# Patient Record
Sex: Male | Born: 1975 | Race: White | Hispanic: No | Marital: Single | Smoking: Current every day smoker
Health system: Southern US, Community
[De-identification: ages and names within clinical notes are randomized; demographics above are authoritative.]

## PROBLEM LIST (undated history)

## (undated) DIAGNOSIS — J45909 Unspecified asthma, uncomplicated: Secondary | ICD-10-CM

## (undated) DIAGNOSIS — Z8669 Personal history of other diseases of the nervous system and sense organs: Secondary | ICD-10-CM

---

## 2004-02-24 ENCOUNTER — Emergency Department (HOSPITAL_COMMUNITY): Admission: AD | Admit: 2004-02-24 | Discharge: 2004-02-24 | Payer: Self-pay | Admitting: Emergency Medicine

## 2004-02-25 ENCOUNTER — Emergency Department (HOSPITAL_COMMUNITY): Admission: AD | Admit: 2004-02-25 | Discharge: 2004-02-25 | Payer: Self-pay | Admitting: Family Medicine

## 2015-10-27 ENCOUNTER — Encounter: Payer: Self-pay | Admitting: Emergency Medicine

## 2015-10-27 ENCOUNTER — Emergency Department
Admission: EM | Admit: 2015-10-27 | Discharge: 2015-10-27 | Disposition: A | Discharge status: Home / Self Care | Payer: Self-pay | Attending: Emergency Medicine | Admitting: Emergency Medicine

## 2015-10-27 ENCOUNTER — Emergency Department: Payer: Self-pay

## 2015-10-27 DIAGNOSIS — Z880 Allergy status to penicillin: Secondary | ICD-10-CM | POA: Insufficient documentation

## 2015-10-27 DIAGNOSIS — S61032A Puncture wound without foreign body of left thumb without damage to nail, initial encounter: Secondary | ICD-10-CM | POA: Insufficient documentation

## 2015-10-27 DIAGNOSIS — Z23 Encounter for immunization: Secondary | ICD-10-CM | POA: Insufficient documentation

## 2015-10-27 DIAGNOSIS — W268XXA Contact with other sharp object(s), not elsewhere classified, initial encounter: Secondary | ICD-10-CM | POA: Insufficient documentation

## 2015-10-27 DIAGNOSIS — Y9289 Other specified places as the place of occurrence of the external cause: Secondary | ICD-10-CM | POA: Insufficient documentation

## 2015-10-27 DIAGNOSIS — F17210 Nicotine dependence, cigarettes, uncomplicated: Secondary | ICD-10-CM | POA: Insufficient documentation

## 2015-10-27 DIAGNOSIS — Y9389 Activity, other specified: Secondary | ICD-10-CM | POA: Insufficient documentation

## 2015-10-27 DIAGNOSIS — Y998 Other external cause status: Secondary | ICD-10-CM | POA: Insufficient documentation

## 2015-10-27 DIAGNOSIS — S61230A Puncture wound without foreign body of right index finger without damage to nail, initial encounter: Secondary | ICD-10-CM | POA: Insufficient documentation

## 2015-10-27 MED ORDER — TETANUS-DIPHTH-ACELL PERTUSSIS 5-2.5-18.5 LF-MCG/0.5 IM SUSP
0.5000 mL | Freq: Once | INTRAMUSCULAR | Status: AC
  Administered 2015-10-27: 0.5 mL via INTRAMUSCULAR
  Filled 2015-10-27: qty 0.5

## 2015-10-27 NOTE — Discharge Instructions (Signed)
Puncture Wound A puncture wound is an injury that is caused by a sharp, thin object that goes through (penetrates) your skin. Usually, a puncture wound does not leave a large opening in your skin, so it may not bleed a lot. However, when you get a puncture wound, dirt or other materials (foreign bodies) can be forced into your wound and break off inside. This increases the chance of infection, such as tetanus. CAUSES Puncture wounds are caused by any sharp, thin object that goes through your skin, such as:  Animal teeth, as with an animal bite.  Sharp, pointed objects, such as nails, splinters of glass, fishhooks, and needles. SYMPTOMS Symptoms of a puncture wound include:  Pain.  Bleeding.  Swelling.  Bruising.  Fluid leaking from the wound.  Numbness, tingling, or loss of function. DIAGNOSIS This condition is diagnosed with a medical history and physical exam. Your wound will be checked to see if it contains any foreign bodies. You may also have X-rays or other imaging tests. TREATMENT Treatment for a puncture wound depends on how serious the wound is. It also depends on whether the wound contains any foreign bodies. Treatment for all types of puncture wounds usually starts with:  Controlling the bleeding.  Washing out the wound with a germ-free (sterile) salt-water solution.  Checking the wound for foreign bodies. Treatment may also include:  Having the wound opened surgically to remove a foreign object.  Closing the wound with stitches (sutures) if it continues to bleed.  Covering the wound with antibiotic ointments and a bandage (dressing).  Receiving a tetanus shot.  Receiving a rabies vaccine. HOME CARE INSTRUCTIONS Medicines  Take or apply over-the-counter and prescription medicines only as told by your health care provider.  If you were prescribed an antibiotic, take or apply it as told by your health care provider. Do not stop using the antibiotic even if  your condition improves. Wound Care  There are many ways to close and cover a wound. For example, a wound can be covered with sutures, skin glue, or adhesive strips. Follow instructions from your health care provider about:  How to take care of your wound.  When and how you should change your dressing.  When you should remove your dressing.  Removing whatever was used to close your wound.  Keep the dressing dry as told by your health care provider. Do not take baths, swim, use a hot tub, or do anything that would put your wound underwater until your health care provider approves.  Clean the wound as told by your health care provider.  Do not scratch or pick at the wound.  Check your wound every day for signs of infection. Watch for:  Redness, swelling, or pain.  Fluid, blood, or pus. General Instructions  Raise (elevate) the injured area above the level of your heart while you are sitting or lying down.  If your puncture wound is in your foot, ask your health care provider if you need to avoid putting weight on your foot and for how long.  Keep all follow-up visits as told by your health care provider. This is important. SEEK MEDICAL CARE IF:  You received a tetanus shot and you have swelling, severe pain, redness, or bleeding at the injection site.  You have a fever.  Your sutures come out.  You notice a bad smell coming from your wound or your dressing.  You notice something coming out of your wound, such as wood or glass.  Your   pain is not controlled with medicine.  You have increased redness, swelling, or pain at the site of your wound.  You have fluid, blood, or pus coming from your wound.  You notice a change in the color of your skin near your wound.  You need to change the dressing frequently due to fluid, blood, or pus draining from your wound.  You develop a new rash.  You develop numbness around your wound. SEEK IMMEDIATE MEDICAL CARE IF:  You  develop severe swelling around your wound.  Your pain suddenly increases and is severe.  You develop painful skin lumps.  You have a red streak going away from your wound.  The wound is on your hand or foot and you cannot properly move a finger or toe.  The wound is on your hand or foot and you notice that your fingers or toes look pale or bluish.   This information is not intended to replace advice given to you by your health care provider. Make sure you discuss any questions you have with your health care provider.   Document Released: 08/20/2005 Document Revised: 08/01/2015 Document Reviewed: 01/03/2015 Elsevier Interactive Patient Education 2016 [employer]  

## 2015-10-27 NOTE — ED Provider Notes (Signed)
[facility] Emergency Department Provider Note  ____________________________________________  Time seen: Approximately 5:02 PM  I have reviewed the triage vital signs and the nursing notes.   HISTORY  Chief Complaint Hand Pain    HPI Jacob Duffy is a 39 y.o. male who presents to the emergency department for right index and left thumb pain. He states that he was changing a tire yesterday when he accidentally impaled these fingers with a metal wire. He states that he has had increased pain, swelling, and foreign body sensation to these digits. He is tried over-the-counter medications with no relief. He is concerned for embedded foreign body. He is unsure of last tetanus shot.   History reviewed. No pertinent past medical history.  There are no active problems to display for this patient.   History reviewed. No pertinent past surgical history.  No current outpatient prescriptions on file.  Allergies Penicillins  No family history on file.  Social History Social History  Substance Use Topics  . Smoking status: Current Some Day Smoker -- 1.00 packs/day    Types: Cigarettes  . Smokeless tobacco: None  . Alcohol Use: Yes    Review of Systems Constitutional: No fever/chills Eyes: No visual changes. ENT: No sore throat. Cardiovascular: Denies chest pain. Respiratory: Denies shortness of breath. Gastrointestinal: No abdominal pain.  No nausea, no vomiting.  No diarrhea.  No constipation. Genitourinary: Negative for dysuria. Musculoskeletal: Negative for back pain. Skin: Negative for rash. Endorses puncture wound to right index and left thumb. Neurological: Negative for headaches, focal weakness or numbness.  10-point ROS otherwise negative.  ____________________________________________   PHYSICAL EXAM:  VITAL SIGNS: ED Triage Vitals  Enc Vitals Group     BP 10/27/15 1652 105/61 mmHg     Pulse Rate 10/27/15 1652 72     Resp 10/27/15  1652 18     Temp 10/27/15 1652 98.2 F (36.8 C)     Temp Source 10/27/15 1652 Oral     SpO2 10/27/15 1652 98 %     Weight 10/27/15 1652 165 lb (74.844 kg)     Height 10/27/15 1652  (1.702 m)     Head Cir --      Peak Flow --      Pain Score 10/27/15 1652 5     Pain Loc --      Pain Edu? --      Excl. in GC? --     Constitutional: Alert and oriented. Well appearing and in no acute distress. Eyes: Conjunctivae are normal. PERRL. EOMI. Head: Atraumatic. Nose: No congestion/rhinnorhea. Mouth/Throat: Mucous membranes are moist.  Oropharynx non-erythematous. Neck: No stridor.   Cardiovascular: Normal rate, regular rhythm. Grossly normal heart sounds.  Good peripheral circulation. Respiratory: Normal respiratory effort.  No retractions. Lungs CTAB. Gastrointestinal: Soft and nontender. No distention. No abdominal bruits. No CVA tenderness. Musculoskeletal: No lower extremity tenderness nor edema.  No joint effusions. Neurologic:  Normal speech and language. No gross focal neurologic deficits are appreciated. No gait instability. Skin:  Skin is warm, dry and intact. No rash noted. Small puncture wound noted to the pad of the second digit right hand. No visible foreign body. Minor edema and erythema directly surrounding puncture wound. Minor puncture wound on the left thumb lateral ear pad. Minor edema and erythema directly surrounding puncture wound. No visible foreign body. Psychiatric: Mood and affect are normal. Speech and behavior are normal.  ____________________________________________   LABS (all labs ordered are listed, but only abnormal results  are displayed)  Labs Reviewed - No data to display ____________________________________________  EKG   ____________________________________________  RADIOLOGY  Second digit x-ray right hand Impression: Negative. No evidence of foreign body.  Thumb x-ray left hand Impression: No evidence of foreign body.  Images were  personally reviewed by myself. ____________________________________________   PROCEDURES  Procedure(s) performed: None  Critical Care performed: No  ____________________________________________   INITIAL IMPRESSION / ASSESSMENT AND PLAN / ED COURSE  Pertinent labs & imaging results that were available during my care of the patient were reviewed by me and considered in my medical decision making (see chart for details).  Patient's history, symptoms, physical exam are taken and the consideration for diagnosis. I advised patient of findings and diagnosis and he verbalizes understanding of same. I explained the treatment plan to the patient and the patient verbalizes understanding and compliance with same. Patient is to follow-up with primary care provider or specialist provided on paperwork for further evaluation and treatment should symptoms persist past treatment course. All of the patient's questions are answered. ____________________________________________   FINAL CLINICAL IMPRESSION(S) / ED DIAGNOSES  Final diagnoses:  Puncture wound of left thumb, initial encounter  Puncture wound of right index finger without foreign body without damage to nail, initial encounter      Racheal PatchesJonathan D Margorie Renner, PA-C 10/27/15 1832  Jene Everyobert Kinner, MD 10/29/15 1434

## 2015-10-27 NOTE — ED Notes (Signed)
Pt states was changing a tire yesterday and when grabbed tire a wire became embedded in right index finger and left thumb.

## 2016-01-31 ENCOUNTER — Emergency Department
Admission: EM | Admit: 2016-01-31 | Discharge: 2016-01-31 | Disposition: A | Discharge status: Home / Self Care | Payer: Self-pay | Attending: Emergency Medicine | Admitting: Emergency Medicine

## 2016-01-31 ENCOUNTER — Encounter: Payer: Self-pay | Admitting: Emergency Medicine

## 2016-01-31 ENCOUNTER — Emergency Department: Payer: Self-pay

## 2016-01-31 DIAGNOSIS — Y9289 Other specified places as the place of occurrence of the external cause: Secondary | ICD-10-CM | POA: Insufficient documentation

## 2016-01-31 DIAGNOSIS — F17210 Nicotine dependence, cigarettes, uncomplicated: Secondary | ICD-10-CM | POA: Insufficient documentation

## 2016-01-31 DIAGNOSIS — W1839XA Other fall on same level, initial encounter: Secondary | ICD-10-CM | POA: Insufficient documentation

## 2016-01-31 DIAGNOSIS — Y998 Other external cause status: Secondary | ICD-10-CM | POA: Insufficient documentation

## 2016-01-31 DIAGNOSIS — Y9389 Activity, other specified: Secondary | ICD-10-CM | POA: Insufficient documentation

## 2016-01-31 DIAGNOSIS — S29001A Unspecified injury of muscle and tendon of front wall of thorax, initial encounter: Secondary | ICD-10-CM | POA: Insufficient documentation

## 2016-01-31 DIAGNOSIS — R0789 Other chest pain: Secondary | ICD-10-CM

## 2016-01-31 DIAGNOSIS — Z880 Allergy status to penicillin: Secondary | ICD-10-CM | POA: Insufficient documentation

## 2016-01-31 HISTORY — DX: Personal history of other diseases of the nervous system and sense organs: Z86.69

## 2016-01-31 HISTORY — DX: Unspecified asthma, uncomplicated: J45.909

## 2016-01-31 LAB — CBC
HCT: 39.4 % — ABNORMAL LOW (ref 40.0–52.0)
HEMOGLOBIN: 13.3 g/dL (ref 13.0–18.0)
MCH: 31.6 pg (ref 26.0–34.0)
MCHC: 33.8 g/dL (ref 32.0–36.0)
MCV: 93.5 fL (ref 80.0–100.0)
Platelets: 241 10*3/uL (ref 150–440)
RBC: 4.21 MIL/uL — ABNORMAL LOW (ref 4.40–5.90)
RDW: 12.9 % (ref 11.5–14.5)
WBC: 11.1 10*3/uL — AB (ref 3.8–10.6)

## 2016-01-31 LAB — BASIC METABOLIC PANEL
Anion gap: 4 — ABNORMAL LOW (ref 5–15)
BUN: 10 mg/dL (ref 6–20)
CHLORIDE: 106 mmol/L (ref 101–111)
CO2: 31 mmol/L (ref 22–32)
Calcium: 8.6 mg/dL — ABNORMAL LOW (ref 8.9–10.3)
Creatinine, Ser: 0.73 mg/dL (ref 0.61–1.24)
Glucose, Bld: 100 mg/dL — ABNORMAL HIGH (ref 65–99)
POTASSIUM: 3.7 mmol/L (ref 3.5–5.1)
SODIUM: 141 mmol/L (ref 135–145)

## 2016-01-31 LAB — TROPONIN I

## 2016-01-31 MED ORDER — NAPROXEN 500 MG PO TABS: 500.0000 mg | ORAL_TABLET | Freq: Two times a day (BID) | ORAL | Status: DC

## 2016-01-31 NOTE — ED Notes (Signed)
MD Stafford at bedside. 

## 2016-01-31 NOTE — ED Notes (Signed)
Patient transported to X-ray 

## 2016-01-31 NOTE — ED Provider Notes (Signed)
[facility] Emergency Department Provider Note  ____________________________________________  Time seen: 6:50 pm  I have reviewed the triage vital signs and the nursing notes.   HISTORY  Chief Complaint Chest Pain    HPI Jacob Duffy is a 40 y.o. male who complains of left-sided chest pain for the past 3 days. This started after he got drunk one night and fell down. He is not sure if he fell on the back area of his chest. It hurts to take a deep breath and hurts to lie on his left side. No nausea vomiting shortness of breath diaphoresis. It's worse with movement. No head injury or neck pain.  Pain is sharp, moderate intensity, nonexertional.   Past Medical History  Diagnosis Date  . History of seizures as a child   . Asthma      There are no active problems to display for this patient.    History reviewed. No pertinent past surgical history.   Current Outpatient Rx  Name  Route  Sig  Dispense  Refill  . naproxen (NAPROSYN) 500 MG tablet   Oral   Take 1 tablet (500 mg total) by mouth 2 (two) times daily with a meal.   20 tablet   0      Allergies Penicillins   No family history on file.  Social History Social History  Substance Use Topics  . Smoking status: Current Some Day Smoker -- 1.00 packs/day    Types: Cigarettes  . Smokeless tobacco: None  . Alcohol Use: Yes    Review of Systems  Constitutional:   No fever or chills. No weight changes Eyes:   No blurry vision or double vision.  ENT:   No sore throat.  Cardiovascular:   Chest pain as above. Respiratory:   No dyspnea or cough. Gastrointestinal:   Negative for abdominal pain, vomiting and diarrhea.  No BRBPR or melena. Genitourinary:   Negative for dysuria or difficulty urinating. Musculoskeletal:   Negative for back pain. No joint swelling or pain. Skin:   Negative for rash. Neurological:   Negative for headaches, focal weakness or numbness. Psychiatric:  No  anxiety or depression.   Endocrine:  No changes in energy or sleep difficulty.  10-point ROS otherwise negative.  ____________________________________________   PHYSICAL EXAM:  VITAL SIGNS: ED Triage Vitals  Enc Vitals Group     BP 01/31/16 1825 111/54 mmHg     Pulse Rate 01/31/16 1825 70     Resp 01/31/16 1825 20     Temp 01/31/16 1825 98.3 F (36.8 C)     Temp Source 01/31/16 1825 Oral     SpO2 01/31/16 1825 96 %     Weight 01/31/16 1825 165 lb (74.844 kg)     Height 01/31/16 1825 5\' 7"  (1.702 m)     Head Cir --      Peak Flow --      Pain Score 01/31/16 1836 10     Pain Loc --      Pain Edu? --      Excl. in GC? --     Vital signs reviewed, nursing assessments reviewed.   Constitutional:   Alert and oriented. Well appearing and in no distress. Eyes:   No scleral icterus. No conjunctival pallor. PERRL. EOMI ENT   Head:   Normocephalic and atraumatic.   Nose:   No congestion/rhinnorhea. No septal hematoma   Mouth/Throat:   MMM, no pharyngeal erythema. No peritonsillar mass.    Neck:  No stridor. No SubQ emphysema. No meningismus. Hematological/Lymphatic/Immunilogical:   No cervical lymphadenopathy. Cardiovascular:   RRR. Symmetric bilateral radial and DP pulses.  No murmurs.  Respiratory:   Normal respiratory effort without tachypnea nor retractions. Breath sounds are clear and equal bilaterally. No wheezes/rales/rhonchi. Gastrointestinal:   Soft and nontender. Non distended. There is no CVA tenderness.  No rebound, rigidity, or guarding. Genitourinary:   deferred Musculoskeletal:   Nontender with normal range of motion in all extremities. No joint effusions.  No lower extremity tenderness.  No edema. Left chest wall tenderness in the area of the left pectoralis. No crepitus or bony point tenderness. Neurologic:   Normal speech and language.  CN 2-10 normal. Motor grossly intact. No gross focal neurologic deficits are appreciated.  Skin:    Skin is  warm, dry and intact. No rash noted.  No petechiae, purpura, or bullae. No ecchymosis Psychiatric:   Mood and affect are normal. ____________________________________________    LABS (pertinent positives/negatives) (all labs ordered are listed, but only abnormal results are displayed) Labs Reviewed  BASIC METABOLIC PANEL - Abnormal; Notable for the following:    Glucose, Bld 100 (*)    Calcium 8.6 (*)    Anion gap 4 (*)    All other components within normal limits  CBC - Abnormal; Notable for the following:    WBC 11.1 (*)    RBC 4.21 (*)    HCT 39.4 (*)    All other components within normal limits  TROPONIN I   ____________________________________________   EKG  Interpreted by me  Date: 01/31/2016  Rate: 67  Rhythm: normal sinus rhythm  QRS Axis: normal  Intervals: normal  ST/T Wave abnormalities: normal  Conduction Disutrbances: none  Narrative Interpretation: unremarkable      ____________________________________________    RADIOLOGY  Chest x-ray unremarkable  ____________________________________________   PROCEDURES   ____________________________________________   INITIAL IMPRESSION / ASSESSMENT AND PLAN / ED COURSE  Pertinent labs & imaging results that were available during my care of the patient were reviewed by me and considered in my medical decision making (see chart for details).  Patient presents with left chest wall pain. Reproducible on exam. Likely due to falling while intoxicated which is impaired his memory of the event. But has some degree of chest wall contusion but no evidence of any pulmonary complications. No evidence of cardiac, patient's. Normal labs and X ray and EKG. Vital signs unremarkable. We'll discharge home, NSAIDs for pain.     ____________________________________________   FINAL CLINICAL IMPRESSION(S) / ED DIAGNOSES  Final diagnoses:  Chest wall pain      Sharman Cheek, MD 01/31/16 1925

## 2016-01-31 NOTE — Discharge Instructions (Signed)

## 2016-01-31 NOTE — ED Notes (Signed)
Pt reports centralized chest pain x3 days, reports pain while laying on side, hurt to take a deep breath. Pt denies nausea, vomiting, shortness of breath. Pt reports falling this weekend, unsure if he landed on side or not.

## 2016-02-13 ENCOUNTER — Encounter: Payer: Self-pay | Admitting: Emergency Medicine

## 2016-02-13 ENCOUNTER — Emergency Department
Admission: EM | Admit: 2016-02-13 | Discharge: 2016-02-13 | Disposition: A | Discharge status: Home / Self Care | Payer: 59 | Attending: Emergency Medicine | Admitting: Emergency Medicine

## 2016-02-13 DIAGNOSIS — Z880 Allergy status to penicillin: Secondary | ICD-10-CM | POA: Insufficient documentation

## 2016-02-13 DIAGNOSIS — K0889 Other specified disorders of teeth and supporting structures: Secondary | ICD-10-CM | POA: Insufficient documentation

## 2016-02-13 DIAGNOSIS — K029 Dental caries, unspecified: Secondary | ICD-10-CM

## 2016-02-13 DIAGNOSIS — F17210 Nicotine dependence, cigarettes, uncomplicated: Secondary | ICD-10-CM | POA: Insufficient documentation

## 2016-02-13 DIAGNOSIS — Z791 Long term (current) use of non-steroidal anti-inflammatories (NSAID): Secondary | ICD-10-CM | POA: Insufficient documentation

## 2016-02-13 MED ORDER — CLINDAMYCIN HCL 300 MG PO CAPS: 300.0000 mg | ORAL_CAPSULE | Freq: Three times a day (TID) | ORAL | Status: DC

## 2016-02-13 NOTE — ED Notes (Signed)
NAD noted at time of D/C. Pt denies questions or concerns. Pt ambulatory to the lobby at this time.  

## 2016-02-13 NOTE — ED Notes (Signed)
PATIENT STATES HE WAS GIVEN A RX FROM [facility] FOR CLINDAMYCIN, BUT HAS NOT PURCHASED THE MEDICATIONS DUE TO NOT BEING ABLE TO AFFORD RX.

## 2016-02-13 NOTE — Discharge Instructions (Signed)
You have been seen in the Emergency Department (ED) today for dental pain.  Please take your prescribed antibiotic.  You may take pain medication as needed but ONLY as prescribed.  You should also take over-the-counter pain medication such as ibuprofen according to the label instructions unless a doctor has previously told you to avoid this type of medication (due to stomach ulcers, for example).  Alternatively you can take ibuprofen 600 mg by mouth three times daily with meals for no more than 5 days.  Please see you dentist as soon as possible; only a dentist will be able to fix your problem(s).  Please see below for dental follow up options.  Return to the ED if you develop worsening pain, fever, pus/drainage, difficulty breathing, or other symptoms that concern you.  Dental Pain   Dental pain may be caused by many things, including:  Tooth decay (cavities or caries). Cavities expose the nerve of your tooth to air and hot or cold temperatures. This can cause pain or discomfort.  Abscess or infection. A dental abscess is a collection of infected pus from a bacterial infection in the inner part of the tooth (pulp). It usually occurs at the end of the tooth's root.  Injury.  An unknown reason (idiopathic). Your pain may be mild or severe. It may only occur when:  You are chewing.  You are exposed to hot or cold temperature.  You are eating or drinking sugary foods or beverages, such as soda or candy. Your pain may also be constant.  HOME CARE INSTRUCTIONS  Watch your dental pain for any changes. The following actions may help to lessen any discomfort that you are feeling:  Take medicines only as directed by your dentist.  If you were prescribed an antibiotic medicine, finish all of it even if you start to feel better.  Keep all follow-up visits as directed by your dentist. This is important.  Do not apply heat to the outside of your face.  Rinse your mouth or gargle with salt water if  directed by your dentist. This helps with pain and swelling.  You can make salt water by adding  tsp of salt to 1 cup of warm water. Apply ice to the painful area of your face:  Put ice in a plastic bag.  Place a towel between your skin and the bag.  Leave the ice on for 20 minutes, 2-3 times per day. Avoid foods or drinks that cause you pain, such as:  Very hot or very cold foods or drinks.  Sweet or sugary foods or drinks. SEEK MEDICAL CARE IF:  Your pain is not controlled with medicines.  Your symptoms are worse.  You have new symptoms. SEEK IMMEDIATE MEDICAL CARE IF:  You are unable to open your mouth.  You are having trouble breathing or swallowing.  You have a fever.  Your face, neck, or jaw is swollen. This information is not intended to replace advice given to you by your health care provider. Make sure you discuss any questions you have with your health care provider.  Document Released: 11/10/2005 Document Revised: 03/27/2015 Document Reviewed: 11/06/2014  Elsevier Interactive Patient Education 2016 Elsevier Inc.     Dental Caries Dental caries (also called tooth decay) is the most common oral disease. It can occur at any age but is more common in children and young adults.  HOW DENTAL CARIES DEVELOPS  The process of decay begins when bacteria and foods (particularly sugars and starches) combine in  your mouth to produce plaque. Plaque is a substance that sticks to the hard, outer surface of a tooth (enamel). The bacteria in plaque produce acids that attack enamel. These acids may also attack the root surface of a tooth (cementum) if it is exposed. Repeated attacks dissolve these surfaces and create holes in the tooth (cavities). If left untreated, the acids destroy the other layers of the tooth.  RISK FACTORS  Frequent sipping of sugary beverages.   Frequent snacking on sugary and starchy foods, especially those that easily get stuck in the teeth.   Poor oral hygiene.    Dry mouth.   Substance abuse such as methamphetamine abuse.   Broken or poor-fitting dental restorations.   Eating disorders.   Gastroesophageal reflux disease (GERD).   Certain radiation treatments to the head and neck. SYMPTOMS In the early stages of dental caries, symptoms are seldom present. Sometimes white, chalky areas may be seen on the enamel or other tooth layers. In later stages, symptoms may include:  Pits and holes on the enamel.  Toothache after sweet, hot, or cold foods or drinks are consumed.  Pain around the tooth.  Swelling around the tooth. DIAGNOSIS  Most of the time, dental caries is detected during a regular dental checkup. A diagnosis is made after a thorough medical and dental history is taken and the surfaces of your teeth are checked for signs of dental caries. Sometimes special instruments, such as lasers, are used to check for dental caries. Dental X-ray exams may be taken so that areas not visible to the eye (such as between the contact areas of the teeth) can be checked for cavities.  TREATMENT  If dental caries is in its early stages, it may be reversed with a fluoride treatment or an application of a remineralizing agent at the dental office. Thorough brushing and flossing at home is needed to aid these treatments. If it is in its later stages, treatment depends on the location and extent of tooth destruction:   If a small area of the tooth has been destroyed, the destroyed area will be removed and cavities will be filled with a material such as gold, silver amalgam, or composite resin.   If a large area of the tooth has been destroyed, the destroyed area will be removed and a cap (crown) will be fitted over the remaining tooth structure.   If the center part of the tooth (pulp) is affected, a procedure called a root canal will be needed before a filling or crown can be placed.   If most of the tooth has been destroyed, the tooth may need to  be pulled (extracted). HOME CARE INSTRUCTIONS You can prevent, stop, or reverse dental caries at home by practicing good oral hygiene. Good oral hygiene includes:  Thoroughly cleaning your teeth at least twice a day with a toothbrush and dental floss.   Using a fluoride toothpaste. A fluoride mouth rinse may also be used if recommended by your dentist or health care provider.   Restricting the amount of sugary and starchy foods and sugary liquids you consume.   Avoiding frequent snacking on these foods and sipping of these liquids.   Keeping regular visits with a dentist for checkups and cleanings. PREVENTION   Practice good oral hygiene.  Consider a dental sealant. A dental sealant is a coating material that is applied by your dentist to the pits and grooves of teeth. The sealant prevents food from being trapped in them. It may protect  the teeth for several years.  Ask about fluoride supplements if you live in a community without fluorinated water or with water that has a low fluoride content. Use fluoride supplements as directed by your dentist or health care provider.  Allow fluoride varnish applications to teeth if directed by your dentist or health care provider.   This information is not intended to replace advice given to you by your health care provider. Make sure you discuss any questions you have with your health care provider.   Document Released: 08/02/2002 Document Revised: 12/01/2014 Document Reviewed: 11/12/2012 Elsevier Interactive Patient Education 2016 [employer].   OPTIONS FOR DENTAL FOLLOW UP CARE  [place] Department of Health and Human Services - Local Safety Net Dental Clinics [email or web address]   [facility] [phone])  Sharl Ma [phone])  [place] [phone])  [facility] [phone])  [facility]  [phone]) This clinic caters to the indigent population and is on a lottery system. Location: [employer] [facility], [employer], [address] [place] Clinic Hours: Wednesdays from 6pm - 9pm, patients seen by a lottery system. For dates, call or go to [email or web address] Services: Cleanings, fillings and simple extractions. Payment Options: DENTAL WORK IS FREE OF CHARGE. Bring proof of income or support. Best way to get seen: Arrive at 5:15 pm - this is a lottery, NOT first come/first serve, so arriving earlier will not increase your chances of being seen.     [facility] [phone] Select option 1 for emergencies   Location: [facility], [place], [address], [place] Clinic Hours: No walk-ins accepted - call the day before to schedule an appointment. Check in times are 9:30 am and 1:30 pm. Services: Simple extractions, temporary fillings, pulpectomy/pulp debridement, uncomplicated abscess drainage. Payment Options: PAYMENT IS DUE AT THE TIME OF SERVICE.  Fee is usually $100-200, additional surgical procedures (e.g. abscess drainage) may be extra. Cash, checks, Visa/MasterCard accepted.  Can file Medicaid if patient is covered for dental - patient should call case worker to check. No discount for [facility] patients. Best way to get seen: MUST call the day before and get onto the schedule. Can usually be seen the next 1-2 days. No walk-ins accepted.     [facility] [phone]   Location: [facility], [address] [place] Clinic Hours: M, W, Th, F 8am or 1:30pm, Tues 9a or 1:30 - first come/first served. Services: Simple extractions, temporary fillings, uncomplicated abscess drainage.  You do not need to be an [facility] resident. Payment Options: PAYMENT IS DUE AT THE TIME OF SERVICE. Dental insurance, otherwise sliding scale - bring  proof of income or support. Depending on income and treatment needed, cost is usually $50-200. Best way to get seen: Arrive early as it is first come/first served.     [facility] [phone]   Location: [address] Clinic Hours: Mon-Thu 8a-5p Services: Most basic dental services including extractions and fillings. Payment Options: PAYMENT IS DUE AT THE TIME OF SERVICE. Sliding scale, up to 50% off - bring proof if income or support. Medicaid with dental option accepted. Best way to get seen: Call to schedule an appointment, can usually be seen within 2 weeks OR they will try to see walk-ins - show up at 8a or 2p (you may have to wait).     [facility] [phone] [facility] RESIDENTS  ONLY   Location: [facility], [address], [place], [place] [id number] Clinic Hours: By appointment only. Monday - Thursday 8am-5pm, Friday 8am-12pm Services: Cleanings, fillings, extractions. Payment Options: PAYMENT IS DUE AT THE TIME OF SERVICE. Cash, Visa or MasterCard. Sliding scale - $30 minimum per service. Best way to get seen: Come in to office, complete packet and make an appointment - need proof of income or support monies for each household member and proof of [place] residence. Usually takes about a month to get in.     [facility] [phone]   Location: [address] [facility] Hours: Walk-in Urgent Care Dental Services are offered Monday-Friday mornings only. The numbers of emergencies accepted daily is limited to the number of providers available. Maximum 15 - Mondays, Wednesdays & Thursdays Maximum 10 - Tuesdays & Fridays Services: You do not need to be a [place] resident to be seen for a dental emergency. Emergencies are defined as pain, swelling, abnormal bleeding, or dental trauma. Walkins will receive x-rays if  needed. NOTE: Dental cleaning is not an emergency. Payment Options: PAYMENT IS DUE AT THE TIME OF SERVICE. Minimum co-pay is $40.00 for uninsured patients. Minimum co-pay is $3.00 for Medicaid with dental coverage. Dental Insurance is accepted and must be presented at time of visit. Medicare does not cover dental. Forms of payment: Cash, credit card, checks. Best way to get seen: If not previously registered with the clinic, walk-in dental registration begins at 7:15 am and is on a first come/first serve basis. If previously registered with the clinic, call to make an appointment.     [facility] [phone] [place] RESIDENTS ONLY   Location: [address], [place], [place] Clinic Hours: Mon-Thu 10a-2p Services: Extractions only! Payment Options: FREE (donations accepted) - bring proof of income or support Best way to get seen: Call and schedule an appointment OR come at 8am on the 1st Monday of every month (except for holidays) when it is first come/first served.     [facility] [phone]   Location: [address], [place] Clinic Hours: Friday mornings Services, Payment Options, Best way to get seen: Call for info

## 2016-02-13 NOTE — ED Provider Notes (Signed)
[facility] Emergency Department Provider Note  ____________________________________________  Time seen: Approximately 4:07 PM  I have reviewed the triage vital signs and the nursing notes.   HISTORY  Chief Complaint Dental Pain    HPI Jacob Duffy is a 40 y.o. male with no significant past medical history who presents with complaint of gradual onset dental pain that has been going on for weeks.  He saw a dentist about one week ago and was given a prescription for clindamycin which she has not filled due to the cost being about $30.  He smokes tobacco daily.  He has no other significant medical issues.  He reports that their plan is to take out a tooth about a month but reportedly he was told to take the antibiotics in the meantime.  He left work last night due to the pain and would like a doctor's note today.  Nothing makes the pain better and heat and cold makes it worse.  The pain is ranging in intensity from mild to severe.  It is primarily in the right upper molars.  He has had no fever/chills, nausea, vomiting, diarrhea, abdominal pain.He describes the pain as sharp and stabbing.   Past Medical History  Diagnosis Date  . History of seizures as a child   . Asthma     There are no active problems to display for this patient.   History reviewed. No pertinent past surgical history.  Current Outpatient Rx  Name  Route  Sig  Dispense  Refill  . clindamycin (CLEOCIN) 300 MG capsule   Oral   Take 1 capsule (300 mg total) by mouth 3 (three) times daily.   30 capsule   0   . naproxen (NAPROSYN) 500 MG tablet   Oral   Take 1 tablet (500 mg total) by mouth 2 (two) times daily with a meal.   20 tablet   0     Allergies Penicillins  No family history on file.  Social History Social History  Substance Use Topics  . Smoking status: Current Some Day Smoker -- 1.00 packs/day    Types: Cigarettes  . Smokeless tobacco: None  . Alcohol Use: Yes     Review of Systems Constitutional: No fever/chills Eyes: No visual changes. ENT: No sore throat.  Dental pain primarily in the right upper molars Cardiovascular: Denies chest pain. Respiratory: Denies shortness of breath. Gastrointestinal: No abdominal pain.  No nausea, no vomiting.  No diarrhea.  No constipation. Genitourinary: Negative for dysuria. Musculoskeletal: Negative for back pain. Skin: Negative for rash. Neurological: Negative for headaches, focal weakness or numbness.  10-point ROS otherwise negative.  ____________________________________________   PHYSICAL EXAM:  VITAL SIGNS: ED Triage Vitals  Enc Vitals Group     BP 02/13/16 1433 108/76 mmHg     Pulse Rate 02/13/16 1432 85     Resp 02/13/16 1432 18     Temp 02/13/16 1432 97.9 F (36.6 C)     Temp Source 02/13/16 1432 Oral     SpO2 02/13/16 1432 97 %     Weight 02/13/16 1432 165 lb (74.844 kg)     Height 02/13/16 1432 5\' 7"  (1.702 m)     Head Cir --      Peak Flow --      Pain Score 02/13/16 1432 10     Pain Loc --      Pain Edu? --      Excl. in GC? --     Constitutional:  Alert and oriented. Well appearing and in no acute distress. Nose: No congestion/rhinnorhea. Mouth/Throat: Mucous membranes are moist.  Oropharynx non-erythematous.  Extensive dental caries throughout.  No evidence of abscess.  No evidence of Ludwig's angina. Respiratory: Normal respiratory effort.  No retractions.  Musculoskeletal: No lower extremity tenderness nor edema. No gross deformities of extremities. Neurologic:  Normal speech and language. No gross focal neurologic deficits are appreciated.  Skin:  Skin is warm, dry and intact. No rash noted. Psychiatric: Mood and affect are normal. Speech and behavior are normal.  ____________________________________________   LABS (all labs ordered are listed, but only abnormal results are displayed)  Labs Reviewed - No data to  display ____________________________________________  EKG  None ____________________________________________  RADIOLOGY   No results found.  ____________________________________________   PROCEDURES  Procedure(s) performed: None  Critical Care performed: No ____________________________________________   INITIAL IMPRESSION / ASSESSMENT AND PLAN / ED COURSE  Pertinent labs & imaging results that were available during my care of the patient were reviewed by me and considered in my medical decision making (see chart for details).  Well-appearing, no acute distress, vital signs are stable.  I asked the patient specifically why he is here, and he said he needs a work note.  I have given him a coupon from [employer] to help with the cost of clindamycin and I gave him a work note stating specifically the hours during which he was in the emergency department today and that he may return to work with no restrictions, as well as a new prescription for Clinda because he cannot find the other one.  I encouraged him to follow up with the dentist which will be able to help him with his problem.  ____________________________________________  FINAL CLINICAL IMPRESSION(S) / ED DIAGNOSES  Final diagnoses:  Pain due to dental caries      NEW MEDICATIONS STARTED DURING THIS VISIT:  New Prescriptions   CLINDAMYCIN (CLEOCIN) 300 MG CAPSULE    Take 1 capsule (300 mg total) by mouth 3 (three) times daily.      Note:  This document was prepared using Dragon voice recognition software and may include unintentional dictation errors.   Loleta Rose, MD 02/13/16 [phone]

## 2016-02-13 NOTE — ED Notes (Signed)
Dental pain for a few weeks now.

## 2016-07-21 ENCOUNTER — Emergency Department
Admission: EM | Admit: 2016-07-21 | Discharge: 2016-07-21 | Disposition: A | Discharge status: Home / Self Care | Payer: 59 | Attending: Emergency Medicine | Admitting: Emergency Medicine

## 2016-07-21 ENCOUNTER — Encounter: Payer: Self-pay | Admitting: Emergency Medicine

## 2016-07-21 DIAGNOSIS — R197 Diarrhea, unspecified: Secondary | ICD-10-CM | POA: Insufficient documentation

## 2016-07-21 DIAGNOSIS — R1084 Generalized abdominal pain: Secondary | ICD-10-CM

## 2016-07-21 DIAGNOSIS — F17210 Nicotine dependence, cigarettes, uncomplicated: Secondary | ICD-10-CM | POA: Insufficient documentation

## 2016-07-21 DIAGNOSIS — M545 Low back pain: Secondary | ICD-10-CM | POA: Insufficient documentation

## 2016-07-21 DIAGNOSIS — J45909 Unspecified asthma, uncomplicated: Secondary | ICD-10-CM | POA: Insufficient documentation

## 2016-07-21 LAB — COMPREHENSIVE METABOLIC PANEL
ALBUMIN: 3.6 g/dL (ref 3.5–5.0)
ALK PHOS: 87 U/L (ref 38–126)
ALT: 33 U/L (ref 17–63)
ANION GAP: 5 (ref 5–15)
AST: 28 U/L (ref 15–41)
BUN: 9 mg/dL (ref 6–20)
CO2: 26 mmol/L (ref 22–32)
Calcium: 8.7 mg/dL — ABNORMAL LOW (ref 8.9–10.3)
Chloride: 109 mmol/L (ref 101–111)
Creatinine, Ser: 0.73 mg/dL (ref 0.61–1.24)
GFR calc Af Amer: >60 mL/min (ref >60)
GFR calc non Af Amer: >60 mL/min (ref >60)
GLUCOSE: 111 mg/dL — AB (ref 65–99)
POTASSIUM: 3.9 mmol/L (ref 3.5–5.1)
SODIUM: 140 mmol/L (ref 135–145)
Total Bilirubin: 1 mg/dL (ref 0.3–1.2)
Total Protein: 5.8 g/dL — ABNORMAL LOW (ref 6.5–8.1)

## 2016-07-21 LAB — CBC
HEMATOCRIT: 41.8 % (ref 40.0–52.0)
HEMOGLOBIN: 14.6 g/dL (ref 13.0–18.0)
MCH: 32.6 pg (ref 26.0–34.0)
MCHC: 35 g/dL (ref 32.0–36.0)
MCV: 93.1 fL (ref 80.0–100.0)
Platelets: 206 10*3/uL (ref 150–440)
RBC: 4.49 MIL/uL (ref 4.40–5.90)
RDW: 13.9 % (ref 11.5–14.5)
WBC: 6.7 10*3/uL (ref 3.8–10.6)

## 2016-07-21 LAB — URINALYSIS COMPLETE WITH MICROSCOPIC (ARMC ONLY)
BACTERIA UA: NONE SEEN
Bilirubin Urine: NEGATIVE
GLUCOSE, UA: NEGATIVE mg/dL
Hgb urine dipstick: NEGATIVE
Ketones, ur: NEGATIVE mg/dL
Nitrite: NEGATIVE
Protein, ur: NEGATIVE mg/dL
Specific Gravity, Urine: 1.001 — ABNORMAL LOW (ref 1.005–1.030)
Squamous Epithelial / LPF: NONE SEEN
pH: 7 (ref 5.0–8.0)

## 2016-07-21 LAB — LIPASE, BLOOD: Lipase: 27 U/L (ref 11–51)

## 2016-07-21 MED ORDER — CIPROFLOXACIN HCL 500 MG PO TABS: 500.0000 mg | ORAL_TABLET | Freq: Two times a day (BID) | ORAL | 0 refills | Status: AC

## 2016-07-21 NOTE — ED Notes (Signed)
Pt reports his water at home has e coli in it.  Pt found out today.  Pt reports intermittent abd pain, diarrhea x 2 today.  No vomiting.   Pt also reports lower back pain.  No fevers. Sx for 2 months.   Pt alert.  Speech clear.

## 2016-07-21 NOTE — ED Triage Notes (Signed)
Patient states the his well is contaminated with ecoli.  C/O diarrhea, abdominal pain, back pain x 2 months.

## 2016-07-21 NOTE — ED Provider Notes (Signed)
[facility] Emergency Department Provider Note        Time seen: ----------------------------------------- 4:02 PM on 07/21/2016 -----------------------------------------    I have reviewed the triage vital signs and the nursing notes.   HISTORY  Chief Complaint Abdominal Pain    HPI Jacob Duffy A Kingston is a 40 y.o. male who presents to ER with abdominal discomfort and diarrhea for the last 2 months. Patient states she's also had some low back pain. He denies fevers or chills, states he just moved into a new place several months ago and found out that the well was contaminated with Escherichia coli. He denies other complaints at this time.   Past Medical History:  Diagnosis Date  . Asthma   . History of seizures as a child     There are no active problems to display for this patient.   History reviewed. No pertinent surgical history.  Allergies Penicillins  Social History Social History  Substance Use Topics  . Smoking status: Current Some Day Smoker    Packs/day: 1.00    Types: Cigarettes  . Smokeless tobacco: Never Used  . Alcohol use Yes    Review of Systems Constitutional: Negative for fever. Cardiovascular: Negative for chest pain. Respiratory: Negative for shortness of breath. Gastrointestinal:Positive for abdominal pain, diarrhea Genitourinary: Negative for dysuria. Musculoskeletal: Positive for low back pain Skin: Negative for rash. Neurological: Negative for headaches, focal weakness or numbness.  10-point ROS otherwise negative.  ____________________________________________   PHYSICAL EXAM:  VITAL SIGNS: ED Triage Vitals [07/21/16 1359]  Enc Vitals Group     BP 114/60     Pulse Rate 78     Resp 16     Temp 98.3 F (36.8 C)     Temp Source Oral     SpO2 98 %     Weight 152 lb (68.9 kg)     Height 5\' 7"  (1.702 m)     Head Circumference      Peak Flow      Pain Score 7     Pain Loc      Pain Edu?      Excl.  in GC?     Constitutional: Alert and oriented. Well appearing and in no distress. Eyes: Conjunctivae are normal. PERRL. Normal extraocular movements. ENT   Head: Normocephalic and atraumatic.   Nose: No congestion/rhinnorhea.   Mouth/Throat: Mucous membranes are moist.   Neck: No stridor. Cardiovascular: Normal rate, regular rhythm. No murmurs, rubs, or gallops. Respiratory: Normal respiratory effort without tachypnea nor retractions. Breath sounds are clear and equal bilaterally. No wheezes/rales/rhonchi. Gastrointestinal: Soft and nontender. Normal bowel sounds Musculoskeletal: Nontender with normal range of motion in all extremities. No lower extremity tenderness nor edema. Neurologic:  Normal speech and language. No gross focal neurologic deficits are appreciated.  Skin:  Skin is warm, dry and intact. No rash noted. Psychiatric: Mood and affect are normal. Speech and behavior are normal.  ____________________________________________  ED COURSE:  Pertinent labs & imaging results that were available during my care of the patient were reviewed by me and considered in my medical decision making (see chart for details). Clinical Course  Patient is in no acute distress, he'll be provided a short course of antibiotics to treat any Escherichia coli infection is present. We will check basic labs well.  Procedures ____________________________________________   LABS (pertinent positives/negatives)  Labs Reviewed  COMPREHENSIVE METABOLIC PANEL - Abnormal; Notable for the following:       Result Value  Glucose, Bld 111 (*)    Calcium 8.7 (*)    Total Protein 5.8 (*)    All other components within normal limits  URINALYSIS COMPLETEWITH MICROSCOPIC ([facility] ONLY) - Abnormal; Notable for the following:    Color, Urine COLORLESS (*)    APPearance CLEAR (*)    Specific Gravity, Urine 1.001 (*)    Leukocytes, UA TRACE (*)    All other components within normal limits  LIPASE,  BLOOD  CBC   ____________________________________________  FINAL ASSESSMENT AND PLAN  Diarrhea  Plan: Patient with labs as dictated above. No clear etiology for his low back pain. I have advised that the diarrhea is not related to low back pain. He will be placed on antibiotics, he is stable for outpatient follow-up as advised to strictly drink bottled water.   Emily Filbert, MD   Note: This dictation was prepared with Dragon dictation. Any transcriptional errors that result from this process are unintentional    Emily Filbert, MD 07/21/16 [phone]

## 2016-08-19 ENCOUNTER — Emergency Department
Admission: EM | Admit: 2016-08-19 | Discharge: 2016-08-19 | Disposition: A | Discharge status: Home / Self Care | Payer: 59 | Attending: Emergency Medicine | Admitting: Emergency Medicine

## 2016-08-19 DIAGNOSIS — Z792 Long term (current) use of antibiotics: Secondary | ICD-10-CM | POA: Insufficient documentation

## 2016-08-19 DIAGNOSIS — J45909 Unspecified asthma, uncomplicated: Secondary | ICD-10-CM | POA: Insufficient documentation

## 2016-08-19 DIAGNOSIS — R110 Nausea: Secondary | ICD-10-CM

## 2016-08-19 DIAGNOSIS — R197 Diarrhea, unspecified: Secondary | ICD-10-CM

## 2016-08-19 DIAGNOSIS — F17210 Nicotine dependence, cigarettes, uncomplicated: Secondary | ICD-10-CM | POA: Insufficient documentation

## 2016-08-19 DIAGNOSIS — J069 Acute upper respiratory infection, unspecified: Secondary | ICD-10-CM | POA: Insufficient documentation

## 2016-08-19 DIAGNOSIS — R5383 Other fatigue: Secondary | ICD-10-CM

## 2016-08-19 DIAGNOSIS — Z791 Long term (current) use of non-steroidal anti-inflammatories (NSAID): Secondary | ICD-10-CM | POA: Insufficient documentation

## 2016-08-19 LAB — COMPREHENSIVE METABOLIC PANEL
ALK PHOS: 101 U/L (ref 38–126)
ALT: 24 U/L (ref 17–63)
AST: 26 U/L (ref 15–41)
Albumin: 3.6 g/dL (ref 3.5–5.0)
Anion gap: 7 (ref 5–15)
BILIRUBIN TOTAL: 0.8 mg/dL (ref 0.3–1.2)
BUN: 9 mg/dL (ref 6–20)
CO2: 26 mmol/L (ref 22–32)
CREATININE: 0.75 mg/dL (ref 0.61–1.24)
Calcium: 8.5 mg/dL — ABNORMAL LOW (ref 8.9–10.3)
Chloride: 106 mmol/L (ref 101–111)
GFR calc Af Amer: >60 mL/min (ref >60)
Glucose, Bld: 116 mg/dL — ABNORMAL HIGH (ref 65–99)
Potassium: 3.3 mmol/L — ABNORMAL LOW (ref 3.5–5.1)
Sodium: 139 mmol/L (ref 135–145)
TOTAL PROTEIN: 5.8 g/dL — AB (ref 6.5–8.1)

## 2016-08-19 LAB — CBC
HEMATOCRIT: 44.1 % (ref 40.0–52.0)
HEMOGLOBIN: 14.9 g/dL (ref 13.0–18.0)
MCH: 31.5 pg (ref 26.0–34.0)
MCHC: 33.8 g/dL (ref 32.0–36.0)
MCV: 93 fL (ref 80.0–100.0)
Platelets: 265 10*3/uL (ref 150–440)
RBC: 4.74 MIL/uL (ref 4.40–5.90)
RDW: 13.1 % (ref 11.5–14.5)
WBC: 8 10*3/uL (ref 3.8–10.6)

## 2016-08-19 LAB — LIPASE, BLOOD: LIPASE: 20 U/L (ref 11–51)

## 2016-08-19 MED ORDER — METOCLOPRAMIDE HCL 10 MG PO TABS: 10.0000 mg | ORAL_TABLET | Freq: Four times a day (QID) | ORAL | 0 refills | Status: DC | PRN

## 2016-08-19 NOTE — ED Notes (Signed)
Pt. Reports nausea, fatigue and diarrhea the past 4 days. Pt. Denies changes in diet or schedule. States home water tx for ecoli about 2 months ago and he is now having the same sx his wife was having when they discovered the home water contamination

## 2016-08-19 NOTE — ED Provider Notes (Signed)
[facility] Emergency Department Provider Note   ____________________________________________   First MD Initiated Contact with Patient 08/19/16 2057     (approximate)  I have reviewed the triage vital signs and the nursing notes.   HISTORY  Chief Complaint Fatigue   HPI Jacob Duffy is a 40 y.o. male with a history of asthma who is presenting to the emergency department today with 4 days of fatigue, diarrhea as well as cough and runny nose. He says that he also started to have nausea tonight and that is the reason why he came to the emergency department. He says that he felt nauseous to the point where he felt like he cannot go to work. He denies any fever. Denies any sick contacts. Is concerned about a well and is home which she says was contaminated with Escherichia coli. However, he says his wife does not have any of the same symptoms as he does. Says that she does not have any diarrhea. Says that the well is also been treated with bleach since the contamination was discovered. Says that he is having 2 episodes of diarrhea per day. Does not report any blood in the stool. Denies any burning with urination. Also reports a cough.   Past Medical History:  Diagnosis Date  . Asthma   . History of seizures as a child     There are no active problems to display for this patient.   No past surgical history on file.  Prior to Admission medications   Medication Sig Start Date End Date Taking? Authorizing Provider  clindamycin (CLEOCIN) 300 MG capsule Take 1 capsule (300 mg total) by mouth 3 (three) times daily. 02/13/16   Loleta Rose, MD  naproxen (NAPROSYN) 500 MG tablet Take 1 tablet (500 mg total) by mouth 2 (two) times daily with a meal. 01/31/16   Sharman Cheek, MD    Allergies Penicillins  No family history on file.  Social History Social History  Substance Use Topics  . Smoking status: Current Some Day Smoker    Packs/day: 1.00    Types:  Cigarettes  . Smokeless tobacco: Never Used  . Alcohol use Yes    Review of Systems Constitutional: No fever/chills Eyes: No visual changes. ENT: No sore throat. Cardiovascular: Denies chest pain. Respiratory: Denies shortness of breath. Gastrointestinal: No abdominal pain. no vomiting.  No diarrhea.  No constipation. Genitourinary: Negative for dysuria. Musculoskeletal: Negative for back pain. Skin: Negative for rash. Neurological: Negative for headaches, focal weakness or numbness.  10-point ROS otherwise negative.  ____________________________________________   PHYSICAL EXAM:  VITAL SIGNS: ED Triage Vitals  Enc Vitals Group     BP 08/19/16 2018 135/82     Pulse Rate 08/19/16 2018 75     Resp 08/19/16 2018 18     Temp 08/19/16 2018 98.1 F (36.7 C)     Temp Source 08/19/16 2018 Oral     SpO2 08/19/16 2018 98 %     Weight 08/19/16 2019 151 lb (68.5 kg)     Height 08/19/16 2019 5\' 5"  (1.651 m)     Head Circumference --      Peak Flow --      Pain Score 08/19/16 2019 6     Pain Loc --      Pain Edu? --      Excl. in GC? --     Constitutional: Alert and oriented. Well appearing and in no acute distress. Eyes: Conjunctivae are normal. PERRL. EOMI. Head:  Atraumatic.Normal TMs bilaterally. Nose: Mild rhinorrhea Mouth/Throat: Mucous membranes are moist.  Oropharynx non-erythematous. Neck: No stridor.   Cardiovascular: Normal rate, regular rhythm. Grossly normal heart sounds.   Respiratory: Normal respiratory effort.  No retractions. Lungs CTAB. Gastrointestinal: Soft and nontender. No distention. No CVA tenderness. Musculoskeletal: No lower extremity tenderness nor edema.  No joint effusions. Neurologic:  Normal speech and language. No gross focal neurologic deficits are appreciated. No gait instability. Skin:  Skin is warm, dry and intact. No rash noted. Psychiatric: Mood and affect are normal. Speech and behavior are  normal.  ____________________________________________   LABS (all labs ordered are listed, but only abnormal results are displayed)  Labs Reviewed  COMPREHENSIVE METABOLIC PANEL - Abnormal; Notable for the following:       Result Value   Potassium 3.3 (*)    Glucose, Bld 116 (*)    Calcium 8.5 (*)    Total Protein 5.8 (*)    All other components within normal limits  CBC  LIPASE, BLOOD  URINALYSIS COMPLETEWITH MICROSCOPIC ([facility] ONLY)   ____________________________________________  EKG   ____________________________________________  RADIOLOGY   ____________________________________________   PROCEDURES  Procedure(s) performed:   Procedures  Critical Care performed:   ____________________________________________   INITIAL IMPRESSION / ASSESSMENT AND PLAN / ED COURSE  Pertinent labs & imaging results that were available during my care of the patient were reviewed by me and considered in my medical decision making (see chart for details).  Patient with likely viral syndrome. Constellation of symptoms makes it more likely that he does not have an infectious diarrhea. Also that his wife does not have diarrhea makes it less likely that the presentation is secondary to infectious diarrhea because she is also drinking the well water. The patient will be given a prescription for antiemetics for his nausea. He'll be discharged home. I discussed with him his likely diagnosis of a virus causing his symptoms. He is understanding what to comply with this plan. We also discussed drinking plenty of fluids. [facility].  Clinical Course     ____________________________________________   FINAL CLINICAL IMPRESSION(S) / ED DIAGNOSES  Diarrhea. Nausea. Upper respiratory infection.    NEW MEDICATIONS STARTED DURING THIS VISIT:  New Prescriptions   No medications on file     Note:  This document was prepared using Dragon voice recognition  software and may include unintentional dictation errors.    Myrna Blazeravid Matthew Ocia Simek, MD 08/19/16 2202

## 2016-08-19 NOTE — ED Triage Notes (Signed)
Pt also co mid abd pain at this time.

## 2016-08-19 NOTE — ED Notes (Signed)
Pt was given a specimen cup and asked to give sample when able

## 2016-08-19 NOTE — ED Triage Notes (Signed)
Pt in with co generalized weakness and nausea. No vomiting but does have diarrhea pt drinking fluids.

## 2016-11-23 ENCOUNTER — Emergency Department: Payer: Self-pay

## 2016-11-23 ENCOUNTER — Emergency Department
Admission: EM | Admit: 2016-11-23 | Discharge: 2016-11-23 | Disposition: A | Discharge status: Home / Self Care | Payer: Self-pay | Attending: Emergency Medicine | Admitting: Emergency Medicine

## 2016-11-23 ENCOUNTER — Encounter: Payer: Self-pay | Admitting: Emergency Medicine

## 2016-11-23 DIAGNOSIS — J069 Acute upper respiratory infection, unspecified: Secondary | ICD-10-CM | POA: Insufficient documentation

## 2016-11-23 DIAGNOSIS — F17210 Nicotine dependence, cigarettes, uncomplicated: Secondary | ICD-10-CM | POA: Insufficient documentation

## 2016-11-23 DIAGNOSIS — J45909 Unspecified asthma, uncomplicated: Secondary | ICD-10-CM | POA: Insufficient documentation

## 2016-11-23 DIAGNOSIS — J029 Acute pharyngitis, unspecified: Secondary | ICD-10-CM

## 2016-11-23 LAB — POCT RAPID STREP A: STREPTOCOCCUS, GROUP A SCREEN (DIRECT): NEGATIVE

## 2016-11-23 MED ORDER — CHLORPHENIRAMINE MALEATE 4 MG PO TABS: 4.0000 mg | ORAL_TABLET | Freq: Two times a day (BID) | ORAL | 0 refills | Status: AC | PRN

## 2016-11-23 MED ORDER — AZITHROMYCIN 250 MG PO TABS: ORAL_TABLET | ORAL | 0 refills | Status: AC

## 2016-11-23 MED ORDER — GUAIFENESIN-CODEINE 100-10 MG/5ML PO SOLN: 10.0000 mL | ORAL | 0 refills | Status: DC | PRN

## 2016-11-23 NOTE — ED Triage Notes (Addendum)
Pt to ED with c/o of sore throat and N/V/D that has been ongoing for about a week. Pt states he has had a non-productive cough.

## 2016-11-23 NOTE — ED Provider Notes (Signed)
[facility] Emergency Department Provider Note   ____________________________________________   None    (approximate)  I have reviewed the triage vital signs and the nursing notes.   HISTORY  Chief Complaint Sore Throat and Emesis    HPI Jacob Duffy is a 40 y.o. male presents for evaluation one-week history of cough congestion and drainage. Patient states he's had a little bit nauseated diarrhea this morning has had 2 loose bowel movements only.   Past Medical History:  Diagnosis Date  . Asthma   . History of seizures as a child     There are no active problems to display for this patient.   History reviewed. No pertinent surgical history.  Prior to Admission medications   Medication Sig Start Date End Date Taking? Authorizing Provider  azithromycin (ZITHROMAX Z-PAK) 250 MG tablet Take 2 tablets (500 mg) on  Day 1,  followed by 1 tablet (250 mg) once daily on Days 2 through 5. 11/23/16   Evangeline Dakinharles M Beers, PA-C  chlorpheniramine (CHLOR-TRIMETON) 4 MG tablet Take 1 tablet (4 mg total) by mouth 2 (two) times daily as needed for allergies or rhinitis. 11/23/16   Charmayne Sheerharles M Beers, PA-C  guaiFENesin-codeine 100-10 MG/5ML syrup Take 10 mLs by mouth every 4 (four) hours as needed for cough. 11/23/16   Evangeline Dakinharles M Beers, PA-C    Allergies Penicillins  History reviewed. No pertinent family history.  Social History Social History  Substance Use Topics  . Smoking status: Current Some Day Smoker    Packs/day: 1.00    Types: Cigarettes  . Smokeless tobacco: Never Used  . Alcohol use Yes    Review of Systems Constitutional: Denies fever/chills ENT: Positive sore throat. Cardiovascular: Denies chest pain. Respiratory: Denies shortness of breath. Positive cough Gastrointestinal: No abdominal pain.  No nausea, Positive vomiting.  Positive diarrhea.  No constipation. Genitourinary: Negative for dysuria. Musculoskeletal: Negative for back  pain. Skin: Negative for rash. Neurological: Negative for headaches, focal weakness or numbness.  10-point ROS otherwise negative.  ____________________________________________   PHYSICAL EXAM:  VITAL SIGNS: ED Triage Vitals  Enc Vitals Group     BP 11/23/16 1649 125/74     Pulse Rate 11/23/16 1649 84     Resp 11/23/16 1649 18     Temp 11/23/16 1649 98.2 F (36.8 C)     Temp Source 11/23/16 1649 Oral     SpO2 11/23/16 1649 100 %     Weight 11/23/16 1649 155 lb (70.3 kg)     Height 11/23/16 1649 5\' 7"  (1.702 m)     Head Circumference --      Peak Flow --      Pain Score 11/23/16 1650 10     Pain Loc --      Pain Edu? --      Excl. in GC? --     Constitutional: Alert and oriented. Well appearing and in no acute distress. Head: Atraumatic. Nose: Positive congestion/rhinnorhea. Mouth/Throat: Mucous membranes are moist.  Oropharynx non-erythematous. Neck: No stridor.  Supple full range of motion Cardiovascular: Normal rate, regular rhythm. Grossly normal heart sounds.  Good peripheral circulation. Respiratory: Normal respiratory effort.  No retractions. Lungs CTAB. Gastrointestinal: Soft and nontender. No distention. No abdominal bruits. No CVA tenderness. Musculoskeletal: No lower extremity tenderness nor edema.  No joint effusions. Neurologic:  Normal speech and language. No gross focal neurologic deficits are appreciated. No gait instability. Skin:  Skin is warm, dry and intact. No rash noted. Psychiatric: Mood  and affect are normal. Speech and behavior are normal.  ____________________________________________   LABS (all labs ordered are listed, but only abnormal results are displayed)  Labs Reviewed  CULTURE, GROUP A STREP [facility]  POCT RAPID STREP A   ____________________________________________  EKG   ____________________________________________  RADIOLOGY  yes's findings. Negative for  pneumonia. ____________________________________________   PROCEDURES  Procedure(s) performed: None  Procedures  Critical Care performed: No  ____________________________________________   INITIAL IMPRESSION / ASSESSMENT AND PLAN / ED COURSE  Pertinent labs & imaging results that were available during my care of the patient were reviewed by me and considered in my medical decision making (see chart for details).  Acute URI. Rx given for Zithromax, chlorpheniramine and Robitussin-AC. Patient follow-up with PCP or return to ER for any worsening symptomology.  Clinical Course      ____________________________________________   FINAL CLINICAL IMPRESSION(S) / ED DIAGNOSES  Final diagnoses:  Pharyngitis, unspecified etiology  Upper respiratory tract infection, unspecified type      NEW MEDICATIONS STARTED DURING THIS VISIT:  New Prescriptions   AZITHROMYCIN (ZITHROMAX Z-PAK) 250 MG TABLET    Take 2 tablets (500 mg) on  Day 1,  followed by 1 tablet (250 mg) once daily on Days 2 through 5.   CHLORPHENIRAMINE (CHLOR-TRIMETON) 4 MG TABLET    Take 1 tablet (4 mg total) by mouth 2 (two) times daily as needed for allergies or rhinitis.   GUAIFENESIN-CODEINE 100-10 MG/5ML SYRUP    Take 10 mLs by mouth every 4 (four) hours as needed for cough.     Note:  This document was prepared using Dragon voice recognition software and may include unintentional dictation errors.   Evangeline Dakinharles M Beers, PA-C 11/23/16 1850    Jeanmarie PlantJames A McShane, MD 11/23/16 [phone]

## 2017-02-02 IMAGING — CR DG CHEST 2V
1 series · 2 of 2 positions shown · non-contrast
Comparison: None.

CLINICAL DATA: Centralized chest pain for 3 days. Pain with deep
breath.

EXAM:
CHEST  2 VIEW

[Series 1: dg chest 2 view · 0.14mm/px · 2 of 2 slices shown]
[im 1/2]
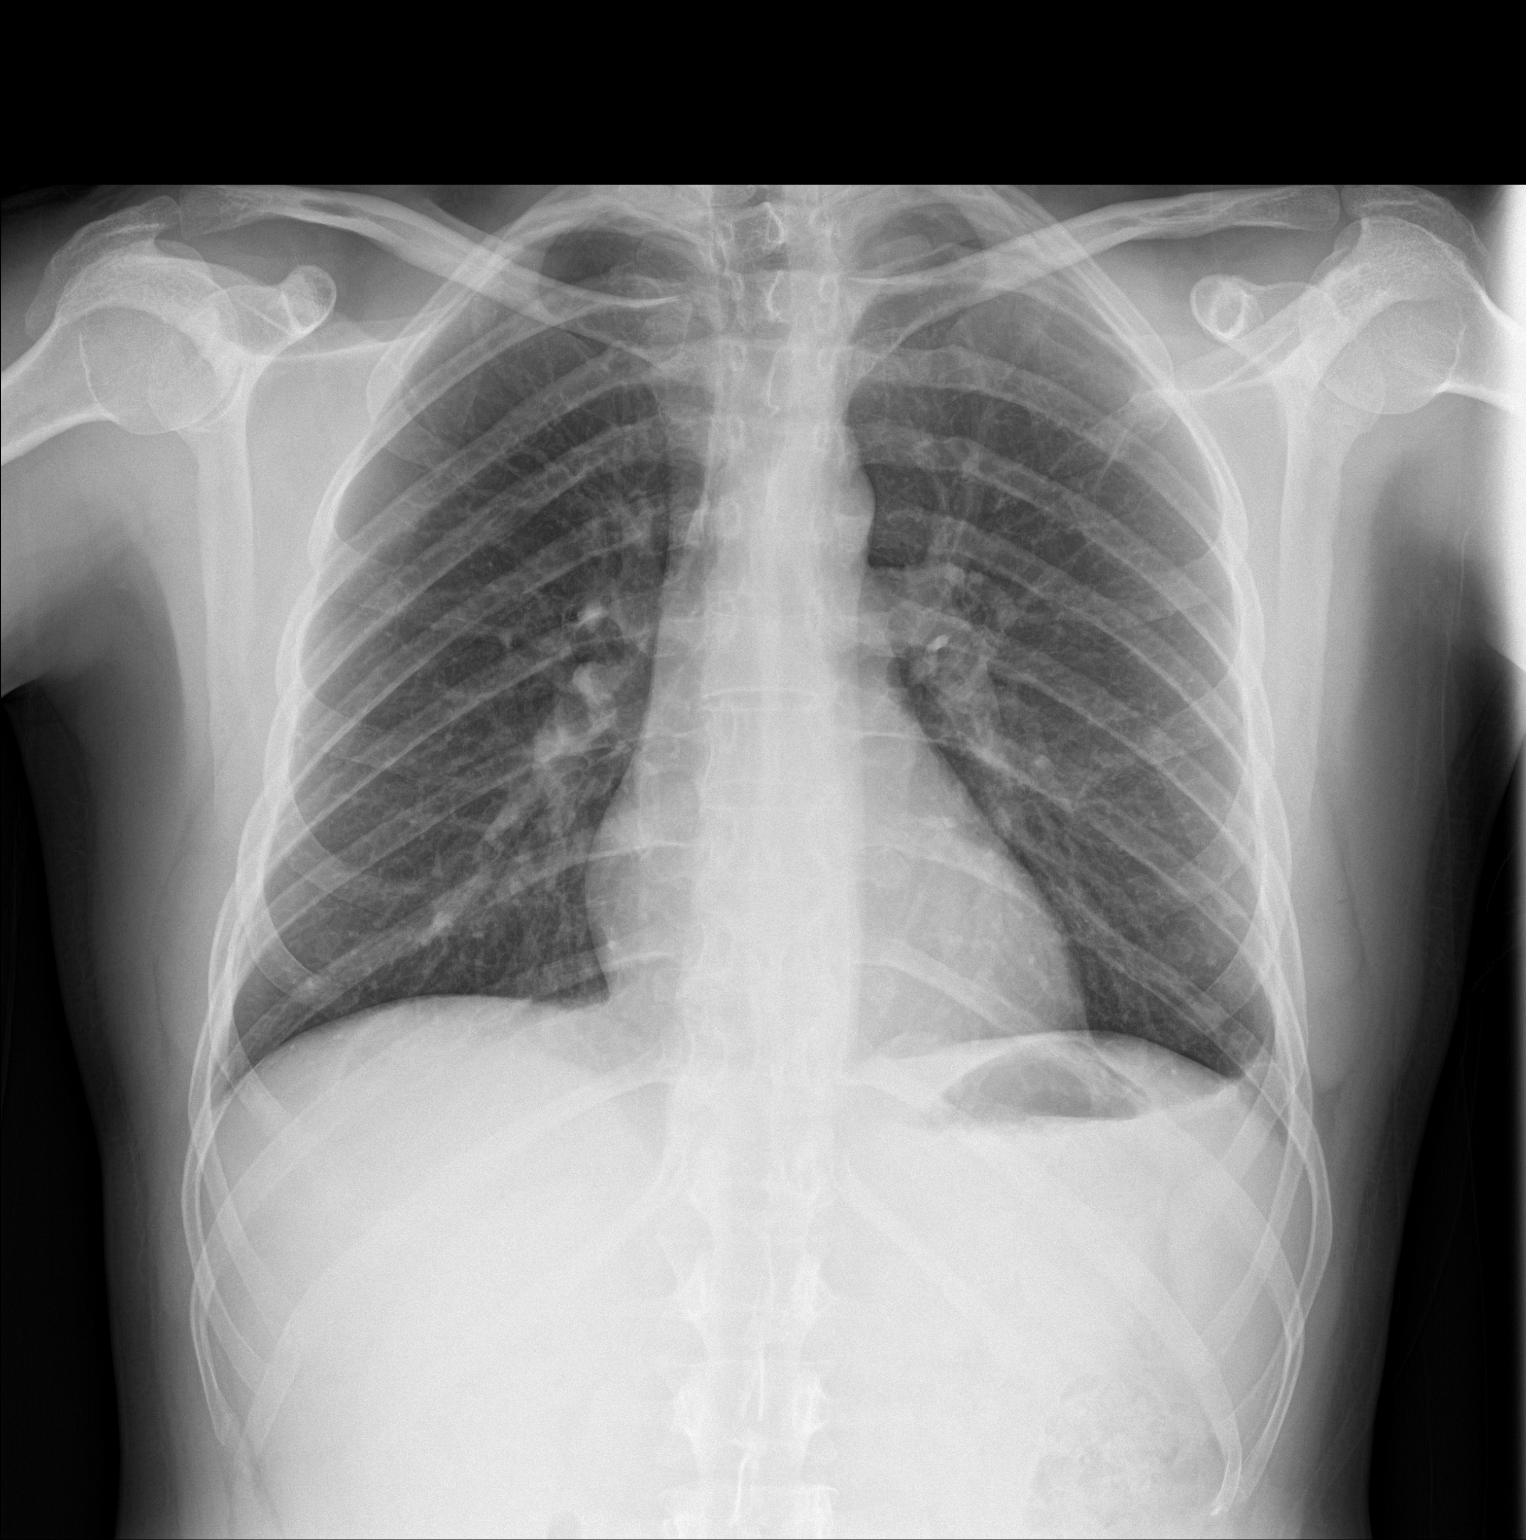
[im 2/2]
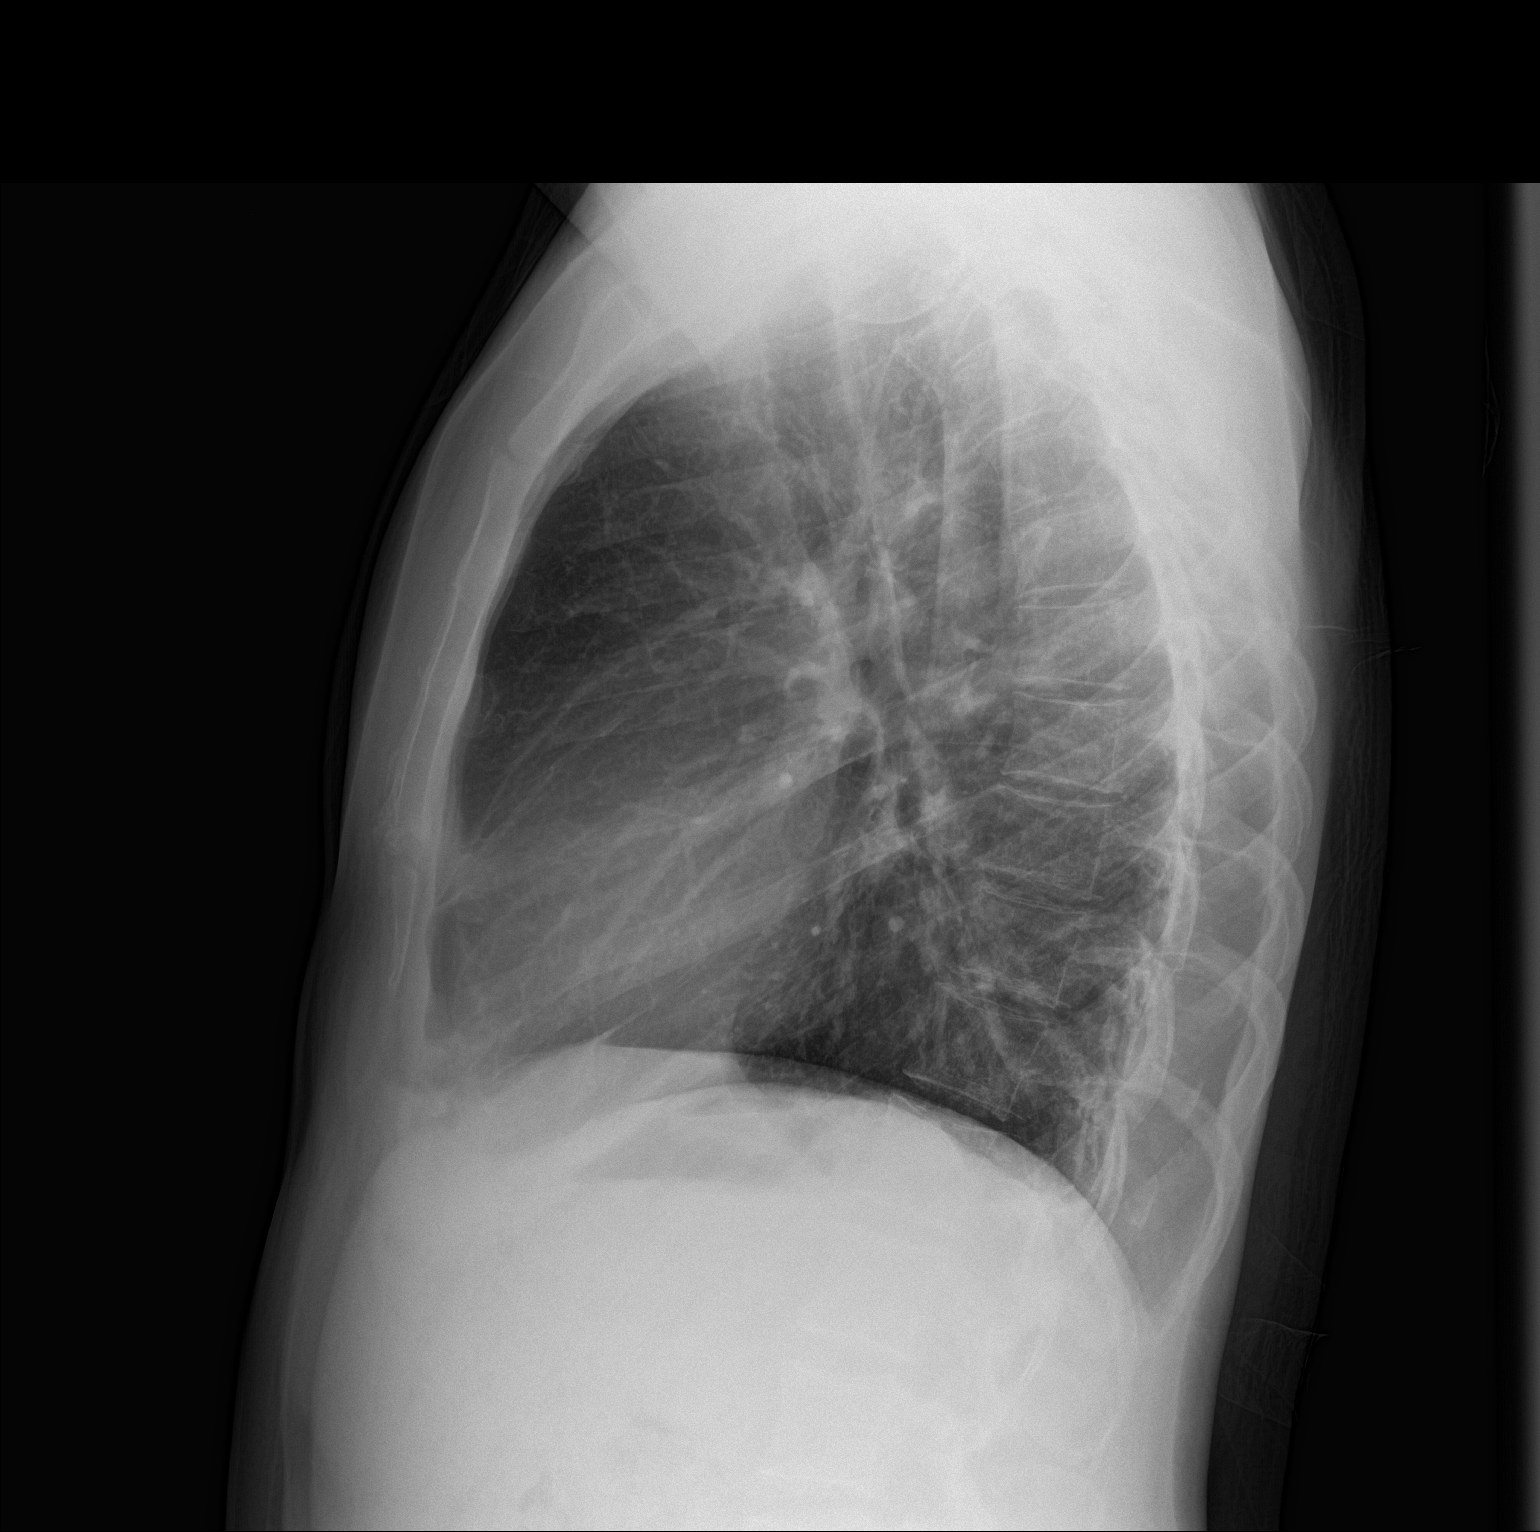

[2 of 2 positions shown; findings below may reference images not displayed]

FINDINGS: Cardiomediastinal silhouette is normal in size and configuration.
Lungs are clear. Lung volumes are normal. No evidence of pneumonia.
No pleural effusion. No pneumothorax.

Osseous and soft tissue structures about the chest are unremarkable.
IMPRESSION: Normal chest x-ray.

## 2017-09-15 ENCOUNTER — Encounter: Payer: Self-pay | Admitting: Emergency Medicine

## 2017-09-15 DIAGNOSIS — L739 Follicular disorder, unspecified: Secondary | ICD-10-CM | POA: Insufficient documentation

## 2017-09-15 DIAGNOSIS — J45909 Unspecified asthma, uncomplicated: Secondary | ICD-10-CM | POA: Insufficient documentation

## 2017-09-15 DIAGNOSIS — F17210 Nicotine dependence, cigarettes, uncomplicated: Secondary | ICD-10-CM | POA: Insufficient documentation

## 2017-09-15 DIAGNOSIS — B86 Scabies: Secondary | ICD-10-CM | POA: Insufficient documentation

## 2017-09-15 MED ORDER — ACETAMINOPHEN 500 MG PO TABS
1000.0000 mg | ORAL_TABLET | Freq: Once | ORAL | Status: AC
  Administered 2017-09-15: 1000 mg via ORAL

## 2017-09-15 MED ORDER — ACETAMINOPHEN 500 MG PO TABS
ORAL_TABLET | ORAL | Status: AC
  Administered 2017-09-15: 1000 mg via ORAL
  Filled 2017-09-15: qty 2

## 2017-09-15 NOTE — ED Triage Notes (Signed)
Pt reports blister to both feet for about a week and rash to left leg since yesterday pt denies any other symptom, pt talks in complete sentences no distress noted

## 2017-09-16 ENCOUNTER — Emergency Department
Admission: EM | Admit: 2017-09-16 | Discharge: 2017-09-16 | Disposition: A | Discharge status: Home / Self Care | Payer: 59 | Attending: Emergency Medicine | Admitting: Emergency Medicine

## 2017-09-16 DIAGNOSIS — L739 Follicular disorder, unspecified: Secondary | ICD-10-CM

## 2017-09-16 DIAGNOSIS — B86 Scabies: Secondary | ICD-10-CM

## 2017-09-16 MED ORDER — CLINDAMYCIN HCL 300 MG PO CAPS: 300.0000 mg | ORAL_CAPSULE | Freq: Three times a day (TID) | ORAL | 0 refills | Status: AC

## 2017-09-16 MED ORDER — CLINDAMYCIN HCL 150 MG PO CAPS
300.0000 mg | ORAL_CAPSULE | Freq: Once | ORAL | Status: AC
  Administered 2017-09-16: 300 mg via ORAL
  Filled 2017-09-16: qty 2

## 2017-09-16 MED ORDER — PERMETHRIN 1 % EX LOTN: TOPICAL_LOTION | CUTANEOUS | 1 refills | Status: AC

## 2017-09-16 NOTE — ED Provider Notes (Signed)
[facility] Emergency Department Provider Note   ____________________________________________   First MD Initiated Contact with Patient 09/16/17 [phone]     (approximate)  I have reviewed the triage vital signs and the nursing notes.   HISTORY  Chief Complaint Rash and Foot Pain    HPI Jacob Duffy is a 41 y.o. male Who comes into the hospital today with blisters on the bottom of his feet. The patient states that it started about a week ago. He's been soaking them in some Epsom salts but he states it has not been helping. 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 The patient denies any fever and states this pain is a 5 out of 10 in intensity currently. He's never had these symptoms before. The patient denies any chest pain shortness of breath or nausea or vomiting. The patient has not gone to see a primary care physician decided to come in tonight discuss he was having persistent pain and discomfort.   Past Medical History:  Diagnosis Date  . Asthma   . History of seizures as a child     There are no active problems to display for this patient.   History reviewed. No pertinent surgical history.  Prior to Admission medications   Medication Sig Start Date End Date Taking? Authorizing Provider  azithromycin (ZITHROMAX Z-PAK) 250 MG tablet Take 2 tablets (500 mg) on  Day 1,  followed by 1 tablet (250 mg) once daily on Days 2 through 5. 11/23/16   Beers, Charmayne Sheer, PA-C  chlorpheniramine (CHLOR-TRIMETON) 4 MG tablet Take 1 tablet (4 mg total) by mouth 2 (two) times daily as needed for allergies or rhinitis. 11/23/16   Beers, Charmayne Sheer, PA-C  clindamycin (CLEOCIN) 300 MG capsule Take 1 capsule (300 mg total) by mouth 3 (three) times daily. 09/16/17 09/26/17  Rebecka Apley, MD  guaiFENesin-codeine 100-10 MG/5ML syrup Take 10 mLs by mouth every 4 (four) hours as needed for cough. 11/23/16   Beers, Charmayne Sheer, PA-C  permethrin  (PERMETHRIN LICE TREATMENT) 1 % lotion Apply to affected area once 09/16/17 09/15/18  Rebecka Apley, MD    Allergies Penicillins  No family history on file.  Social History Social History  Substance Use Topics  . Smoking status: Current Some Day Smoker    Packs/day: 1.00    Types: Cigarettes  . Smokeless tobacco: Never Used  . Alcohol use Yes    Review of Systems  Constitutional: No fever/chills Eyes: No visual changes. ENT: No sore throat. Cardiovascular: Denies chest pain. Respiratory: Denies shortness of breath. Gastrointestinal: No abdominal pain.  No nausea, no vomiting.  No diarrhea.  No constipation. Genitourinary: Negative for dysuria. Musculoskeletal: Negative for back pain. Skin: rash to bilateral feet Neurological: Negative for headaches, focal weakness or numbness.   ____________________________________________   PHYSICAL EXAM:  VITAL SIGNS: ED Triage Vitals  Enc Vitals Group     BP 09/15/17 2222 124/77     Pulse Rate 09/15/17 2222 94     Resp 09/15/17 2222 20     Temp 09/15/17 2222 98.9 F (37.2 C)     Temp Source 09/15/17 2222 Oral     SpO2 09/15/17 2222 97 %     Weight 09/15/17 2224 171 lb (77.6 kg)     Height 09/15/17 2224 5\' 7"  (1.702 m)     Head Circumference --      Peak Flow --      Pain  Score 09/15/17 2221 9     Pain Loc --      Pain Edu? --      Excl. in GC? --     Constitutional: Alert and oriented. Well appearing and in mild distress. Eyes: Conjunctivae are normal. PERRL. EOMI. Head: Atraumatic. Nose: No congestion/rhinnorhea. Mouth/Throat: Mucous membranes are moist.  Oropharynx non-erythematous. Cardiovascular: Normal rate, regular rhythm. Grossly normal heart sounds.  Good peripheral circulation. Respiratory: Normal respiratory effort.  No retractions. Lungs CTAB. Gastrointestinal: Soft and nontender. No distention. positive bowel sounds Musculoskeletal: No lower extremity tenderness nor edema.   Neurologic:  Normal  speech and language.  Skin:  Skin is warm, dry and intact. punctate lesions at the bilateral foot interdigital spaces with some mild surrounding erythema, no blisters noted to feet no significant erythema noted. Psychiatric: Mood and affect are normal.   ____________________________________________   LABS (all labs ordered are listed, but only abnormal results are displayed)  Labs Reviewed - No data to display ____________________________________________  EKG  none ____________________________________________  RADIOLOGY  No results found.  ____________________________________________   PROCEDURES  Procedure(s) performed: None  Procedures  Critical Care performed: No  ____________________________________________   INITIAL IMPRESSION / ASSESSMENT AND PLAN / ED COURSE  As part of my medical decision making, I reviewed the following data within the electronic MEDICAL RECORD NUMBER Notes from prior ED visits and [place] Controlled Substance Database   This is a 41 year old who comes into the hospital today with some rash to his bilateral feet. The patient is had this for a week.  Given the exam my differential diagnosis includes scabies, folliculitis, cellulitis.  The patient's pain is improved from previous. I will give the patient a dose of clindamycin for possible folliculitis and some permethrin for scabies. The patient will be discharged home to follow-up.      ____________________________________________   FINAL CLINICAL IMPRESSION(S) / ED DIAGNOSES  Final diagnoses:  Folliculitis  Scabies      NEW MEDICATIONS STARTED DURING THIS VISIT:  New Prescriptions   CLINDAMYCIN (CLEOCIN) 300 MG CAPSULE    Take 1 capsule (300 mg total) by mouth 3 (three) times daily.   PERMETHRIN (PERMETHRIN LICE TREATMENT) 1 % LOTION    Apply to affected area once     Note:  This document was prepared using Dragon voice recognition software and may include unintentional dictation  errors.    Rebecka ApleyWebster, Paulett Kaufhold P, MD 09/16/17 [phone]

## 2017-09-16 NOTE — ED Notes (Signed)
Pt to the er for foot blisters x 1 week. Pt says he has been soaking his feet in epsom salts once a day x 3 days. Pt does work with [employer] and wears sneakers to work. Pt reports burning to both feet. Pt has not had problems in the 6 months he has worked that job. Pt also reports burning to the back of the left leg that began 3 days ago. Pt has small red blisters to the top of both feet and the bottom and side of the left foot. Pt has a mild rash to the left upper leg and back of left leg.

## 2017-09-16 NOTE — Discharge Instructions (Signed)
Please follow up with the acute care clinic for further evaluation of your rash

## 2017-12-12 ENCOUNTER — Emergency Department
Admission: EM | Admit: 2017-12-12 | Discharge: 2017-12-12 | Disposition: A | Discharge status: Home / Self Care | Payer: 59 | Attending: Emergency Medicine | Admitting: Emergency Medicine

## 2017-12-12 DIAGNOSIS — M545 Low back pain, unspecified: Secondary | ICD-10-CM

## 2017-12-12 DIAGNOSIS — J45909 Unspecified asthma, uncomplicated: Secondary | ICD-10-CM | POA: Insufficient documentation

## 2017-12-12 DIAGNOSIS — Y9389 Activity, other specified: Secondary | ICD-10-CM | POA: Insufficient documentation

## 2017-12-12 DIAGNOSIS — X500XXA Overexertion from strenuous movement or load, initial encounter: Secondary | ICD-10-CM | POA: Insufficient documentation

## 2017-12-12 DIAGNOSIS — T148XXA Other injury of unspecified body region, initial encounter: Secondary | ICD-10-CM

## 2017-12-12 DIAGNOSIS — Y999 Unspecified external cause status: Secondary | ICD-10-CM | POA: Insufficient documentation

## 2017-12-12 DIAGNOSIS — Y929 Unspecified place or not applicable: Secondary | ICD-10-CM | POA: Insufficient documentation

## 2017-12-12 DIAGNOSIS — F17210 Nicotine dependence, cigarettes, uncomplicated: Secondary | ICD-10-CM | POA: Insufficient documentation

## 2017-12-12 MED ORDER — KETOROLAC TROMETHAMINE 30 MG/ML IJ SOLN
15.0000 mg | Freq: Once | INTRAMUSCULAR | Status: AC
  Administered 2017-12-12: 15 mg via INTRAMUSCULAR
  Filled 2017-12-12: qty 1

## 2017-12-12 MED ORDER — LIDOCAINE 5 % EX PTCH: 1.0000 | MEDICATED_PATCH | Freq: Two times a day (BID) | CUTANEOUS | 0 refills | Status: AC

## 2017-12-12 MED ORDER — CYCLOBENZAPRINE HCL 5 MG PO TABS: 5.0000 mg | ORAL_TABLET | Freq: Three times a day (TID) | ORAL | 0 refills | Status: AC | PRN

## 2017-12-12 MED ORDER — MELOXICAM 15 MG PO TABS: 15.0000 mg | ORAL_TABLET | Freq: Every day | ORAL | 2 refills | Status: DC

## 2017-12-12 MED ORDER — CYCLOBENZAPRINE HCL 10 MG PO TABS
5.0000 mg | ORAL_TABLET | Freq: Once | ORAL | Status: AC
  Administered 2017-12-12: 5 mg via ORAL
  Filled 2017-12-12: qty 1

## 2017-12-12 MED ORDER — LIDOCAINE 5 % EX PTCH
1.0000 | MEDICATED_PATCH | CUTANEOUS | Status: DC
  Administered 2017-12-12: 1 via TRANSDERMAL
  Filled 2017-12-12: qty 1

## 2017-12-12 NOTE — ED Provider Notes (Signed)
[facility] Emergency Department Provider Note  ____________________________________________   First MD Initiated Contact with Patient 12/12/17 0330     (approximate)  I have reviewed the triage vital signs and the nursing notes.   HISTORY  Chief Complaint Back Pain (lower)    HPI Jacob Duffy is a 42 y.o. male who presents for evaluation of acute onset right-sided low back pain that occurred when he was setting down a heavy object that he was carrying.  He felt like something pulled in his back muscles.  He does not have any midline pain and no pain radiating down into his legs.  Lying still and flat makes it better and moving around makes it worse.  It is not hurting him as long as he is not moving.  He has had no difficulty urinating including no urinary retention, no urinary incontinence, and no bowel incontinence or constipation.  He has no numbness or tingling anywhere.  He has no hematuria.  He denies fever/chills, chest pain, shortness of breath, nausea, vomiting, and abdominal pain.  He has no history of back pain.  [facility] care.  Past Medical History:  Diagnosis Date  . Asthma   . History of seizures as a child     There are no active problems to display for this patient.   History reviewed. No pertinent surgical history.  Prior to Admission medications   Medication Sig Start Date End Date Taking? Authorizing Provider  azithromycin (ZITHROMAX Z-PAK) 250 MG tablet Take 2 tablets (500 mg) on  Day 1,  followed by 1 tablet (250 mg) once daily on Days 2 through 5. 11/23/16   Beers, Charmayne Sheerharles M, PA-C  chlorpheniramine (CHLOR-TRIMETON) 4 MG tablet Take 1 tablet (4 mg total) by mouth 2 (two) times daily as needed for allergies or rhinitis. 11/23/16   Beers, Charmayne Sheerharles M, PA-C  cyclobenzaprine (FLEXERIL) 5 MG tablet Take 1 tablet (5 mg total) by mouth 3 (three) times daily as needed for muscle spasms. 12/12/17   Loleta RoseForbach, Jeffree Cazeau, MD    guaiFENesin-codeine 100-10 MG/5ML syrup Take 10 mLs by mouth every 4 (four) hours as needed for cough. 11/23/16   Beers, Charmayne Sheerharles M, PA-C  lidocaine (LIDODERM) 5 % Place 1 patch onto the skin every 12 (twelve) hours. Remove & Discard patch within 12 hours or as directed by MD.  Wynelle FannyLeave the patch off for 12 hours before applying a new one. 12/12/17 12/12/18  Loleta RoseForbach, Kailash Hinze, MD  meloxicam (MOBIC) 15 MG tablet Take 1 tablet (15 mg total) by mouth daily. 12/12/17 12/12/18  Loleta RoseForbach, Phinneas Shakoor, MD  permethrin (PERMETHRIN LICE TREATMENT) 1 % lotion Apply to affected area once 09/16/17 09/15/18  Rebecka ApleyWebster, Allison P, MD    Allergies Penicillins  No family history on file.  Social History Social History   Tobacco Use  . Smoking status: Current Some Day Smoker    Packs/day: 1.00    Types: Cigarettes  . Smokeless tobacco: Never Used  Substance Use Topics  . Alcohol use: Yes  . Drug use: No    Review of Systems Constitutional: No fever/chills Eyes: No visual changes. ENT: No sore throat. Cardiovascular: Denies chest pain. Respiratory: Denies shortness of breath. Gastrointestinal: No abdominal pain.  No nausea, no vomiting.  No diarrhea.  No constipation. Genitourinary: Negative for dysuria. Musculoskeletal: Acute onset right-sided low back pain with no radiation and no numbness nor tingling Integumentary: Negative for rash. Neurological: Negative for headaches, focal weakness or numbness.  ____________________________________________   PHYSICAL EXAM:  VITAL SIGNS: ED Triage Vitals  Enc Vitals Group     BP 12/12/17 0014 129/63     Pulse Rate 12/12/17 0014 74     Resp 12/12/17 0014 16     Temp 12/12/17 0014 98.1 F (36.7 C)     Temp Source 12/12/17 0014 Oral     SpO2 12/12/17 0014 99 %     Weight 12/12/17 0014 76.7 kg (169 lb)     Height 12/12/17 0014 1.829 m (6')     Head Circumference --      Peak Flow --      Pain Score 12/12/17 0024 10     Pain Loc --      Pain Edu? --      Excl.  in GC? --     Constitutional: Alert and oriented. Well appearing and in no acute distress. Eyes: Conjunctivae are normal.  Head: Atraumatic. Nose: No congestion/rhinnorhea. Cardiovascular: Normal rate, regular rhythm. Good peripheral circulation. Grossly normal heart sounds. Respiratory: Normal respiratory effort.  No retractions. Lungs CTAB. Gastrointestinal: Soft and nontender. No distention.  Musculoskeletal: Tenderness to palpation of the muscles of the right side of the lumbar region.  No midline tenderness, no step-offs no deformities.  No induration nor fluctuance nor erythema. Neurologic:  Normal speech and language. No gross focal neurologic deficits are appreciated.  Limping gait due to pain but no ataxia Skin:  Skin is warm, dry and intact. No rash noted. Psychiatric: Mood and affect are normal. Speech and behavior are normal.  ____________________________________________   LABS (all labs ordered are listed, but only abnormal results are displayed)  Labs Reviewed - No data to display ____________________________________________  EKG  None - EKG not ordered by ED physician ____________________________________________  RADIOLOGY   No results found.  ____________________________________________   PROCEDURES  Critical Care performed: No   Procedure(s) performed:   Procedures   ____________________________________________   INITIAL IMPRESSION / ASSESSMENT AND PLAN / ED COURSE  As part of my medical decision making, I reviewed the following data within the electronic MEDICAL RECORD NUMBER Nursing notes reviewed and incorporated and Notes from prior ED visits    Differential diagnosis includes, but is not limited to, muscle strain, herniated disc, cauda equina syndrome, osteomyelitis/discitis with or without epidural abscess, cellulitis or cutaneous abscess.  Patient is well-appearing and in no acute distress with normal vital signs.  His physical exam is  reassuring with easily reproducible musculoskeletal pain/tenderness in the right lower back.  He has no evidence of any neurological deficits and there is no indication for imaging at this time.  I will treat him symptomatically for his musculoskeletal pain and gave my usual customary return precautions.  He understands and plan.  There is no evidence to suggest an acute or emergent cause of back pain at this time.     ____________________________________________  FINAL CLINICAL IMPRESSION(S) / ED DIAGNOSES  Final diagnoses:  Acute right-sided low back pain without sciatica  Muscle strain     MEDICATIONS GIVEN DURING THIS VISIT:  Medications  lidocaine (LIDODERM) 5 % 1 patch (not administered)  ketorolac (TORADOL) 30 MG/ML injection 15 mg (not administered)  cyclobenzaprine (FLEXERIL) tablet 5 mg (not administered)     ED Discharge Orders        Ordered    cyclobenzaprine (FLEXERIL) 5 MG tablet  3 times daily PRN     12/12/17 0347    meloxicam (MOBIC) 15 MG tablet  Daily  12/12/17 0347    lidocaine (LIDODERM) 5 %  Every 12 hours     12/12/17 0347       Note:  This document was prepared using Dragon voice recognition software and may include unintentional dictation errors.    Loleta Rose, MD 12/12/17 [phone]

## 2017-12-12 NOTE — ED Triage Notes (Signed)
Patient c/o lower back pain. Patient reports lifting heavy objects earlier today.

## 2017-12-12 NOTE — ED Notes (Signed)
Pt complains of right sided mid back pain that is worse with movement and "deep breath" per pt. Pt is ambulatory without difficulty, denies other symptoms. Skin normal color, dry. resps unlabored.

## 2017-12-12 NOTE — Discharge Instructions (Signed)
You have been seen in the Emergency Department (ED)  today for back pain.  Your workup and exam have not shown any acute abnormalities and you are likely suffering from muscle strain or possible problems with your discs, but there is no treatment that will fix your symptoms at this time.  Please take the medications prescribed as needed. You can also take Tylenol (acetaminophen).  Be careful because the Flexeril (cyclobenzaprine) may make you sleepy, so use caution driving or operating machinery after taking the medication.  Please follow up with your doctor as soon as possible regarding today's ED visit and your back pain.  Return to the ED for worsening back pain, fever, weakness or numbness of either leg, or if you develop either (1) an inability to urinate or have bowel movements, or (2) loss of your ability to control your bathroom functions (if you start having "accidents"), or if you develop other new symptoms that concern you.

## 2018-06-21 ENCOUNTER — Emergency Department
Admission: EM | Admit: 2018-06-21 | Discharge: 2018-06-21 | Disposition: A | Discharge status: Home / Self Care | Payer: Self-pay | Attending: Emergency Medicine | Admitting: Emergency Medicine

## 2018-06-21 ENCOUNTER — Other Ambulatory Visit: Payer: Self-pay

## 2018-06-21 ENCOUNTER — Encounter: Payer: Self-pay | Admitting: *Deleted

## 2018-06-21 DIAGNOSIS — F17210 Nicotine dependence, cigarettes, uncomplicated: Secondary | ICD-10-CM | POA: Insufficient documentation

## 2018-06-21 DIAGNOSIS — B353 Tinea pedis: Secondary | ICD-10-CM | POA: Insufficient documentation

## 2018-06-21 DIAGNOSIS — J45998 Other asthma: Secondary | ICD-10-CM | POA: Insufficient documentation

## 2018-06-21 MED ORDER — NAFTIFINE HCL 2 % EX CREA: TOPICAL_CREAM | CUTANEOUS | 0 refills | Status: AC

## 2018-06-21 NOTE — Discharge Instructions (Signed)
Please keep feet clean and dry.  Change socks while at work.  Try to wear open toed shoes while at home.  Applied prescription medication to both feet daily x2 weeks.

## 2018-06-21 NOTE — ED Notes (Signed)
See assessment by PA-C 

## 2018-06-21 NOTE — ED Provider Notes (Addendum)
[facility] EMERGENCY DEPARTMENT Provider Note   CSN: [id number] Arrival date & time: 06/21/18  1750     History   Chief Complaint No chief complaint on file.   HPI Jacob Duffy is a 42 y.o. male presents to the emergency department for evaluation of rash to right foot x1 week.  He denies any trauma or injury.  No fevers warmth redness.  He describes a pruritic rash between the toes of his right feet and no rash on his left foot.  No drainage noted.  He is not diabetic.  He has not tried any creams.  He wears closed toe shoes and states he works in a very hot environment or his feet sweat tremendously.  HPI  Past Medical History:  Diagnosis Date  . Asthma   . History of seizures as a child     There are no active problems to display for this patient.   No past surgical history on file.      Home Medications    Prior to Admission medications   Medication Sig Start Date End Date Taking? Authorizing Provider  azithromycin (ZITHROMAX Z-PAK) 250 MG tablet Take 2 tablets (500 mg) on  Day 1,  followed by 1 tablet (250 mg) once daily on Days 2 through 5. 11/23/16   Beers, Charmayne Sheer, PA-C  chlorpheniramine (CHLOR-TRIMETON) 4 MG tablet Take 1 tablet (4 mg total) by mouth 2 (two) times daily as needed for allergies or rhinitis. 11/23/16   Beers, Charmayne Sheer, PA-C  cyclobenzaprine (FLEXERIL) 5 MG tablet Take 1 tablet (5 mg total) by mouth 3 (three) times daily as needed for muscle spasms. 12/12/17   Loleta Rose, MD  guaiFENesin-codeine 100-10 MG/5ML syrup Take 10 mLs by mouth every 4 (four) hours as needed for cough. 11/23/16   Beers, Charmayne Sheer, PA-C  lidocaine (LIDODERM) 5 % Place 1 patch onto the skin every 12 (twelve) hours. Remove & Discard patch within 12 hours or as directed by MD.  Wynelle Fanny the patch off for 12 hours before applying a new one. 12/12/17 12/12/18  Loleta Rose, MD  meloxicam (MOBIC) 15 MG tablet Take 1 tablet (15 mg total) by mouth daily.  12/12/17 12/12/18  Loleta Rose, MD  Naftifine HCl 2 % CREA Apply daily to affected area and half an inch margin surrounding area x2 weeks 06/21/18   Evon Slack, PA-C  permethrin (PERMETHRIN LICE TREATMENT) 1 % lotion Apply to affected area once 09/16/17 09/15/18  Rebecka Apley, MD    Family History No family history on file.  Social History Social History   Tobacco Use  . Smoking status: Current Some Day Smoker    Packs/day: 1.00    Types: Cigarettes  . Smokeless tobacco: Never Used  Substance Use Topics  . Alcohol use: Yes  . Drug use: No     Allergies   Penicillins   Review of Systems Review of Systems  Constitutional: Negative for chills and fever.  Musculoskeletal: Negative for arthralgias, back pain, gait problem, joint swelling and myalgias.  Skin: Positive for rash. Negative for pallor and wound.  Neurological: Negative for numbness.     Physical Exam Updated Vital Signs BP 112/68 (BP Location: Left Arm)   Pulse 70   Temp 98.4 F (36.9 C) (Oral)   Ht 5\' 6"  (1.676 m)   Wt 77.1 kg (170 lb)   SpO2 94%   BMI 27.44 kg/m   Physical Exam  Constitutional: He is oriented to person,  place, and time. He appears well-developed and well-nourished.  HENT:  Head: Normocephalic and atraumatic.  Eyes: Conjunctivae are normal.  Neck: Normal range of motion.  Cardiovascular: Normal rate.  Pulmonary/Chest: Effort normal. No respiratory distress.  Musculoskeletal: Normal range of motion.  Neurological: He is alert and oriented to person, place, and time.  Skin: Skin is warm. No rash noted.  Interdigital type lesions between the right foot toes with whitish scales and mild fissuring with mild maceration of interdigital areas.  No open wounds, abscess formation or drainage.  Left foot appears well with no rash noted.  There is no cellulitis.  No tenderness palpation throughout bilateral feet.  Is neurovascular intact in bilateral lower extremities.  Psychiatric:  He has a normal mood and affect. His behavior is normal. Thought content normal.     ED Treatments / Results  Labs (all labs ordered are listed, but only abnormal results are displayed) Labs Reviewed - No data to display  EKG None  Radiology No results found.  Procedures Procedures (including critical care time)  Medications Ordered in ED Medications - No data to display   Initial Impression / Assessment and Plan / ED Course  I have reviewed the triage vital signs and the nursing notes.  Pertinent labs & imaging results that were available during my care of the patient were reviewed by me and considered in my medical decision making (see chart for details).     42 year old male with tinea pedis to the right foot.  He is given a prescription for antifungal cream to apply daily for 2 weeks.  He will follow-up with podiatry if no improvement 2 weeks.  He is educated on signs and symptoms return to the ED for.  He is also educated on proper hygiene to prevent further spreading of the fungal infection.  He will change his socks frequently while at work.  He will keep his feet dry when he is at home. Final Clinical Impressions(s) / ED Diagnoses   Final diagnoses:  Tinea pedis, right    ED Discharge Orders        Ordered    Naftifine HCl 2 % CREA     06/21/18 1814       Evon SlackGaines, Sondos Wolfman C, PA-C 06/21/18 1818    Evon SlackGaines, Dan Dissinger C, PA-C 06/21/18 1818    Dionne BucySiadecki, Sebastian, MD 06/21/18 2211

## 2018-06-21 NOTE — ED Triage Notes (Signed)
Itching rash to R foot x 1 week. Pt denies relief from foot powder.

## 2018-07-30 ENCOUNTER — Emergency Department
Admission: EM | Admit: 2018-07-30 | Discharge: 2018-07-30 | Disposition: A | Discharge status: Home / Self Care | Payer: Self-pay | Attending: Emergency Medicine | Admitting: Emergency Medicine

## 2018-07-30 ENCOUNTER — Emergency Department: Payer: Self-pay

## 2018-07-30 ENCOUNTER — Other Ambulatory Visit: Payer: Self-pay

## 2018-07-30 ENCOUNTER — Encounter: Payer: Self-pay | Admitting: Emergency Medicine

## 2018-07-30 DIAGNOSIS — F17210 Nicotine dependence, cigarettes, uncomplicated: Secondary | ICD-10-CM | POA: Insufficient documentation

## 2018-07-30 DIAGNOSIS — Z79899 Other long term (current) drug therapy: Secondary | ICD-10-CM | POA: Insufficient documentation

## 2018-07-30 DIAGNOSIS — J45909 Unspecified asthma, uncomplicated: Secondary | ICD-10-CM | POA: Insufficient documentation

## 2018-07-30 DIAGNOSIS — R1032 Left lower quadrant pain: Secondary | ICD-10-CM | POA: Insufficient documentation

## 2018-07-30 LAB — COMPREHENSIVE METABOLIC PANEL
ALT: 36 U/L (ref 0–44)
AST: 39 U/L (ref 15–41)
Albumin: 3.8 g/dL (ref 3.5–5.0)
Alkaline Phosphatase: 86 U/L (ref 38–126)
Anion gap: 5 (ref 5–15)
BUN: 9 mg/dL (ref 6–20)
CO2: 26 mmol/L (ref 22–32)
Calcium: 8.5 mg/dL — ABNORMAL LOW (ref 8.9–10.3)
Chloride: 108 mmol/L (ref 98–111)
Creatinine, Ser: 0.73 mg/dL (ref 0.61–1.24)
GFR calc Af Amer: >60 mL/min (ref >60)
GFR calc non Af Amer: >60 mL/min (ref >60)
Glucose, Bld: 107 mg/dL — ABNORMAL HIGH (ref 70–99)
Potassium: 3.5 mmol/L (ref 3.5–5.1)
Sodium: 139 mmol/L (ref 135–145)
Total Bilirubin: 1.7 mg/dL — ABNORMAL HIGH (ref 0.3–1.2)
Total Protein: 6 g/dL — ABNORMAL LOW (ref 6.5–8.1)

## 2018-07-30 LAB — CBC WITH DIFFERENTIAL/PLATELET
Basophils Absolute: 0.1 10*3/uL (ref 0–0.1)
Basophils Relative: 1 %
Eosinophils Absolute: 0.1 10*3/uL (ref 0–0.7)
Eosinophils Relative: 1 %
HCT: 40.9 % (ref 40.0–52.0)
Hemoglobin: 14.1 g/dL (ref 13.0–18.0)
Lymphocytes Relative: 14 %
Lymphs Abs: 1.5 10*3/uL (ref 1.0–3.6)
MCH: 31.7 pg (ref 26.0–34.0)
MCHC: 34.5 g/dL (ref 32.0–36.0)
MCV: 91.8 fL (ref 80.0–100.0)
Monocytes Absolute: 0.7 10*3/uL (ref 0.2–1.0)
Monocytes Relative: 7 %
Neutro Abs: 8.5 10*3/uL — ABNORMAL HIGH (ref 1.4–6.5)
Neutrophils Relative %: 77 %
Platelets: 264 10*3/uL (ref 150–440)
RBC: 4.46 MIL/uL (ref 4.40–5.90)
RDW: 13.5 % (ref 11.5–14.5)
WBC: 10.8 10*3/uL — ABNORMAL HIGH (ref 3.8–10.6)

## 2018-07-30 MED ORDER — HYDROCODONE-ACETAMINOPHEN 5-325 MG PO TABS: 1.0000 | ORAL_TABLET | Freq: Four times a day (QID) | ORAL | 0 refills | Status: AC | PRN

## 2018-07-30 MED ORDER — HYDROCODONE-ACETAMINOPHEN 5-325 MG PO TABS
1.0000 | ORAL_TABLET | Freq: Once | ORAL | Status: AC
  Administered 2018-07-30: 1 via ORAL
  Filled 2018-07-30: qty 1

## 2018-07-30 MED ORDER — IOPAMIDOL (ISOVUE-300) INJECTION 61%
100.0000 mL | Freq: Once | INTRAVENOUS | Status: AC | PRN
  Administered 2018-07-30: 100 mL via INTRAVENOUS
  Filled 2018-07-30: qty 100

## 2018-07-30 MED ORDER — MELOXICAM 15 MG PO TABS: 15.0000 mg | ORAL_TABLET | Freq: Every day | ORAL | 1 refills | Status: AC
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
## 2018-07-30 NOTE — ED Notes (Signed)
Non adherent dressing applied to left forearm with Kerlix over dressing.
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
## 2018-08-15 ENCOUNTER — Other Ambulatory Visit: Payer: Self-pay

## 2018-08-15 ENCOUNTER — Encounter: Payer: Self-pay | Admitting: Emergency Medicine

## 2018-08-15 ENCOUNTER — Emergency Department
Admission: EM | Admit: 2018-08-15 | Discharge: 2018-08-15 | Disposition: A | Discharge status: Home / Self Care | Payer: Self-pay | Attending: Emergency Medicine | Admitting: Emergency Medicine

## 2018-08-15 DIAGNOSIS — Y92410 Unspecified street and highway as the place of occurrence of the external cause: Secondary | ICD-10-CM | POA: Insufficient documentation

## 2018-08-15 DIAGNOSIS — J45909 Unspecified asthma, uncomplicated: Secondary | ICD-10-CM | POA: Insufficient documentation

## 2018-08-15 DIAGNOSIS — S301XXA Contusion of abdominal wall, initial encounter: Secondary | ICD-10-CM | POA: Insufficient documentation

## 2018-08-15 DIAGNOSIS — Y939 Activity, unspecified: Secondary | ICD-10-CM | POA: Insufficient documentation

## 2018-08-15 DIAGNOSIS — Z79899 Other long term (current) drug therapy: Secondary | ICD-10-CM | POA: Insufficient documentation

## 2018-08-15 DIAGNOSIS — F17210 Nicotine dependence, cigarettes, uncomplicated: Secondary | ICD-10-CM | POA: Insufficient documentation

## 2018-08-15 DIAGNOSIS — T148XXA Other injury of unspecified body region, initial encounter: Secondary | ICD-10-CM

## 2018-08-15 DIAGNOSIS — Y999 Unspecified external cause status: Secondary | ICD-10-CM | POA: Insufficient documentation

## 2018-08-15 LAB — CBC
HCT: 42.5 % (ref 40.0–52.0)
Hemoglobin: 15.1 g/dL (ref 13.0–18.0)
MCH: 33 pg (ref 26.0–34.0)
MCHC: 35.6 g/dL (ref 32.0–36.0)
MCV: 92.8 fL (ref 80.0–100.0)
PLATELETS: 285 10*3/uL (ref 150–440)
RBC: 4.58 MIL/uL (ref 4.40–5.90)
RDW: 13.1 % (ref 11.5–14.5)
WBC: 8.3 10*3/uL (ref 3.8–10.6)

## 2018-08-15 LAB — URINALYSIS, COMPLETE (UACMP) WITH MICROSCOPIC
Bacteria, UA: NONE SEEN
Bilirubin Urine: NEGATIVE
Glucose, UA: NEGATIVE mg/dL
Hgb urine dipstick: NEGATIVE
KETONES UR: NEGATIVE mg/dL
Leukocytes, UA: NEGATIVE
Nitrite: NEGATIVE
PROTEIN: NEGATIVE mg/dL
Specific Gravity, Urine: 1.014 (ref 1.005–1.030)
pH: 6 (ref 5.0–8.0)

## 2018-08-15 LAB — COMPREHENSIVE METABOLIC PANEL
ALT: 26 U/L (ref 0–44)
AST: 22 U/L (ref 15–41)
Albumin: 4.1 g/dL (ref 3.5–5.0)
Alkaline Phosphatase: 105 U/L (ref 38–126)
Anion gap: 9 (ref 5–15)
BUN: 7 mg/dL (ref 6–20)
CO2: 28 mmol/L (ref 22–32)
CREATININE: 0.77 mg/dL (ref 0.61–1.24)
Calcium: 9 mg/dL (ref 8.9–10.3)
Chloride: 103 mmol/L (ref 98–111)
GFR calc non Af Amer: >60 mL/min (ref >60)
Glucose, Bld: 101 mg/dL — ABNORMAL HIGH (ref 70–99)
Potassium: 3.9 mmol/L (ref 3.5–5.1)
SODIUM: 140 mmol/L (ref 135–145)
Total Bilirubin: 1 mg/dL (ref 0.3–1.2)
Total Protein: 6.8 g/dL (ref 6.5–8.1)

## 2018-08-15 LAB — LIPASE, BLOOD: LIPASE: 24 U/L (ref 11–51)
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
## 2018-12-21 ENCOUNTER — Other Ambulatory Visit: Payer: Self-pay

## 2018-12-21 ENCOUNTER — Emergency Department: Payer: Self-pay

## 2018-12-21 ENCOUNTER — Emergency Department
Admission: EM | Admit: 2018-12-21 | Discharge: 2018-12-21 | Disposition: A | Discharge status: Home / Self Care | Payer: Self-pay | Attending: Emergency Medicine | Admitting: Emergency Medicine

## 2018-12-21 DIAGNOSIS — J069 Acute upper respiratory infection, unspecified: Secondary | ICD-10-CM | POA: Insufficient documentation

## 2018-12-21 DIAGNOSIS — F17210 Nicotine dependence, cigarettes, uncomplicated: Secondary | ICD-10-CM | POA: Insufficient documentation

## 2018-12-21 DIAGNOSIS — J45909 Unspecified asthma, uncomplicated: Secondary | ICD-10-CM | POA: Insufficient documentation

## 2018-12-21 MED ORDER — ALBUTEROL SULFATE HFA 108 (90 BASE) MCG/ACT IN AERS: 2.0000 | INHALATION_SPRAY | RESPIRATORY_TRACT | 1 refills | Status: AC | PRN

## 2018-12-21 MED ORDER — IPRATROPIUM-ALBUTEROL 0.5-2.5 (3) MG/3ML IN SOLN
3.0000 mL | Freq: Once | RESPIRATORY_TRACT | Status: AC
  Administered 2018-12-21: 3 mL via RESPIRATORY_TRACT
  Filled 2018-12-21: qty 3

## 2018-12-21 MED ORDER — GUAIFENESIN-CODEINE 100-10 MG/5ML PO SOLN: 10.0000 mL | Freq: Three times a day (TID) | ORAL | 0 refills | Status: AC | PRN

## 2018-12-21 NOTE — ED Triage Notes (Signed)
C/O flu like symptoms x 1 week.  C/O intermittent chest pain with inspiration and cough.

## 2018-12-21 NOTE — ED Provider Notes (Signed)
[facility] Emergency Department Provider Note  ____________________________________________  Time seen: Approximately 3:37 PM  I have reviewed the triage vital signs and the nursing notes.   HISTORY  Chief Complaint No chief complaint on file.   HPI Jacob Duffy is a 43 y.o. male who presents to the emergency department for treatment and evaluation  of cough and flulike symptoms for the past week.  He has had a fever intermittently.  He has had some chest pain with coughing.  He does smoke approximately 1 pack of cigarettes per day.  No alleviating measures attempted prior to arrival.   Past Medical History:  Diagnosis Date  . Asthma   . History of seizures as a child     There are no active problems to display for this patient.   No past surgical history on file.  Prior to Admission medications   Medication Sig Start Date End Date Taking? Authorizing Provider  albuterol (PROVENTIL HFA;VENTOLIN HFA) 108 (90 Base) MCG/ACT inhaler Inhale 2 puffs into the lungs every 4 (four) hours as needed for wheezing or shortness of breath. 12/21/18   Donelda Mailhot, Rulon Eisenmengerari B, FNP  azithromycin (ZITHROMAX Z-PAK) 250 MG tablet Take 2 tablets (500 mg) on  Day 1,  followed by 1 tablet (250 mg) once daily on Days 2 through 5. 11/23/16   Beers, Charmayne Sheerharles M, PA-C  chlorpheniramine (CHLOR-TRIMETON) 4 MG tablet Take 1 tablet (4 mg total) by mouth 2 (two) times daily as needed for allergies or rhinitis. 11/23/16   Beers, Charmayne Sheerharles M, PA-C  cyclobenzaprine (FLEXERIL) 5 MG tablet Take 1 tablet (5 mg total) by mouth 3 (three) times daily as needed for muscle spasms. 12/12/17   Loleta RoseForbach, Cory, MD  guaiFENesin-codeine 100-10 MG/5ML syrup Take 10 mLs by mouth 3 (three) times daily as needed. 12/21/18   Chinita Pesterriplett, Mayara Paulson B, FNP  Naftifine HCl 2 % CREA Apply daily to affected area and half an inch margin surrounding area x2 weeks 06/21/18   Evon SlackGaines, Thomas C, PA-C    Allergies Penicillins  No family  history on file.  Social History Social History   Tobacco Use  . Smoking status: Current Some Day Smoker    Packs/day: 1.00    Types: Cigarettes  . Smokeless tobacco: Never Used  Substance Use Topics  . Alcohol use: Yes  . Drug use: No    Review of Systems Constitutional: Positive for fever/chills.  Normal appetite. ENT: No sore throat. Cardiovascular: Positive for chest pain. Respiratory: Negative for shortness of breath.  Positive for cough.  Positive for wheezing.  Gastrointestinal: Negative for nausea, no vomiting.  No diarrhea.  Musculoskeletal: Negative for body aches Skin: Negative for rash. Neurological: Negative for headaches ____________________________________________   PHYSICAL EXAM:  VITAL SIGNS: ED Triage Vitals  Enc Vitals Group     BP 12/21/18 1520 127/71     Pulse Rate 12/21/18 1520 86     Resp 12/21/18 1519 16     Temp 12/21/18 1519 99 F (37.2 C)     Temp Source 12/21/18 1519 Oral     SpO2 12/21/18 1520 96 %     Weight 12/21/18 1518 169 lb (76.7 kg)     Height 12/21/18 1518 5\' 7"  (1.702 m)     Head Circumference --      Peak Flow --      Pain Score 12/21/18 1518 0     Pain Loc --      Pain Edu? --  Excl. in GC? --     Constitutional: Alert and oriented.  Acutely ill appearing and in no acute distress. Eyes: Conjunctivae are normal. Ears: Bilateral tympanic membranes are normal Nose: Maxillary sinus congestion noted; no rhinnorhea. Mouth/Throat: Mucous membranes are moist.  Oropharynx erythematous. Tonsils 1+ without exudate. Uvula midline. Neck: No stridor.  Lymphatic: No cervical lymphadenopathy. Cardiovascular: Normal rate, regular rhythm. Good peripheral circulation. Respiratory: Respirations are even and unlabored.  No retractions.  Scattered wheezing noted on auscultation. Gastrointestinal: Soft and nontender.  Musculoskeletal: FROM x 4 extremities.  Neurologic:  Normal speech and language. Skin:  Skin is warm, dry and intact.  No rash noted. Psychiatric: Mood and affect are normal. Speech and behavior are normal.  ____________________________________________   LABS (all labs ordered are listed, but only abnormal results are displayed)  Labs Reviewed - No data to display ____________________________________________  EKG  ED ECG REPORT I, [name], FNP-BC personally viewed and interpreted this ECG.   Date: 12/21/2018  EKG Time: 5:37pm  Rate: 68  Rhythm: normal sinus rhythm  Axis: Left  Intervals:none  ST&T Change: No ST elevation  ____________________________________________  RADIOLOGY  Chest x-ray is negative for acute cardiopulmonary abnormality per radiology. ____________________________________________   PROCEDURES  Procedure(s) performed: None  Critical Care performed: No ____________________________________________   INITIAL IMPRESSION / ASSESSMENT AND PLAN / ED COURSE  43 y.o. male presents to the emergency department for treatment and evaluation of flulike symptoms with chest pain.  Chest x-ray is reassuring.  He is wheezing and will be given a breathing treatment while here.  EKG has also been requested, although for like this is most likely pain secondary to cough.   ----------------------------------------- 5:31 PM on 12/21/2018 -----------------------------------------  Breath sounds much improved after HHN Tx. He will be discharged home with albuterol and robitussin AC. He is to follow up with the PCP of his choice for symptoms that are not improving over the next few days. He is to return to the ER for symptoms that change or worsen if unable to schedule an appointment.  Medications  ipratropium-albuterol (DUONEB) 0.5-2.5 (3) MG/3ML nebulizer solution 3 mL (3 mLs Nebulization Given 12/21/18 1709)    ED Discharge Orders         Ordered    albuterol (PROVENTIL HFA;VENTOLIN HFA) 108 (90 Base) MCG/ACT inhaler  Every 4 hours PRN     12/21/18 1737    guaiFENesin-codeine  100-10 MG/5ML syrup  3 times daily PRN     12/21/18 1737           Pertinent labs & imaging results that were available during my care of the patient were reviewed by me and considered in my medical decision making (see chart for details).    If controlled substance prescribed during this visit, 12 month history viewed on the [facility] prior to issuing an initial prescription for Schedule II or III opiod. ____________________________________________   FINAL CLINICAL IMPRESSION(S) / ED DIAGNOSES  Final diagnoses:  Upper respiratory tract infection, unspecified type    Note:  This document was prepared using Dragon voice recognition software and may include unintentional dictation errors.     Chinita Pesterriplett, Jazsmin Couse B, FNP 12/21/18 1738    Nita SickleVeronese, [place], MD 12/21/18 [phone]

## 2018-12-21 NOTE — Discharge Instructions (Signed)
Follow up with primary care or return to the ER for symptoms that change or worsen. 

## 2018-12-21 NOTE — ED Notes (Addendum)
Pt c/o body aches, fever, and cough X 1week. Reports chest pain when coughing
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
## 2021-07-22 ENCOUNTER — Emergency Department
Admission: EM | Admit: 2021-07-22 | Discharge: 2021-07-22 | Disposition: A | Discharge status: Home / Self Care | Payer: Self-pay | Attending: Emergency Medicine | Admitting: Emergency Medicine

## 2021-07-22 ENCOUNTER — Other Ambulatory Visit: Payer: Self-pay

## 2021-07-22 ENCOUNTER — Encounter: Payer: Self-pay | Admitting: *Deleted

## 2021-07-22 DIAGNOSIS — F17210 Nicotine dependence, cigarettes, uncomplicated: Secondary | ICD-10-CM | POA: Insufficient documentation

## 2021-07-22 DIAGNOSIS — S80861A Insect bite (nonvenomous), right lower leg, initial encounter: Secondary | ICD-10-CM | POA: Insufficient documentation

## 2021-07-22 DIAGNOSIS — Z23 Encounter for immunization: Secondary | ICD-10-CM | POA: Insufficient documentation

## 2021-07-22 DIAGNOSIS — J45909 Unspecified asthma, uncomplicated: Secondary | ICD-10-CM | POA: Insufficient documentation

## 2021-07-22 DIAGNOSIS — W57XXXA Bitten or stung by nonvenomous insect and other nonvenomous arthropods, initial encounter: Secondary | ICD-10-CM | POA: Insufficient documentation

## 2021-07-22 MED ORDER — TETANUS-DIPHTH-ACELL PERTUSSIS 5-2.5-18.5 LF-MCG/0.5 IM SUSY
0.5000 mL | PREFILLED_SYRINGE | Freq: Once | INTRAMUSCULAR | Status: AC
  Administered 2021-07-22: 0.5 mL via INTRAMUSCULAR
  Filled 2021-07-22: qty 0.5

## 2021-07-22 MED ORDER — CLINDAMYCIN HCL 300 MG PO CAPS: 300.0000 mg | ORAL_CAPSULE | Freq: Three times a day (TID) | ORAL | 0 refills | Status: AC

## 2021-07-22 MED ORDER — HYDROXYZINE HCL 25 MG PO TABS: 25.0000 mg | ORAL_TABLET | Freq: Three times a day (TID) | ORAL | 0 refills | Status: AC | PRN

## 2021-07-22 NOTE — ED Provider Notes (Signed)
[facility] Emergency Department Provider Note  ____________________________________________   Event Date/Time   First MD Initiated Contact with Patient 07/22/21 1933     (approximate)  I have reviewed the triage vital signs and the nursing notes.   HISTORY  Chief Complaint Insect Bite    HPI Jacob Duffy is a 45 y.o. male is otherwise healthy comes in with bite to his right leg.  Patient reports that he had a lot of itching noted to his right medial aspect of his leg for 2 to 3 days.  He did not look down to see if he had any bug bite or not.  He states that today around 5 PM he looked down he noticed some redness of the skin and he is concerned that he could have had a bug bite given he has been outside a lot.  His wife is concerned it could be a brown recluse bite.  He reports the itching is severe, nothing makes it better or worse.      Past Medical History:  Diagnosis Date   Asthma    History of seizures as a child     There are no problems to display for this patient.   No past surgical history on file.  Prior to Admission medications   Medication Sig Start Date End Date Taking? Authorizing Provider  albuterol (PROVENTIL HFA;VENTOLIN HFA) 108 (90 Base) MCG/ACT inhaler Inhale 2 puffs into the lungs every 4 (four) hours as needed for wheezing or shortness of breath. 12/21/18   Triplett, Rulon Eisenmenger B, FNP  azithromycin (ZITHROMAX Z-PAK) 250 MG tablet Take 2 tablets (500 mg) on  Day 1,  followed by 1 tablet (250 mg) once daily on Days 2 through 5. 11/23/16   Beers, Charmayne Sheer, PA-C  chlorpheniramine (CHLOR-TRIMETON) 4 MG tablet Take 1 tablet (4 mg total) by mouth 2 (two) times daily as needed for allergies or rhinitis. 11/23/16   Beers, Charmayne Sheer, PA-C  cyclobenzaprine (FLEXERIL) 5 MG tablet Take 1 tablet (5 mg total) by mouth 3 (three) times daily as needed for muscle spasms. 12/12/17   Loleta Rose, MD  guaiFENesin-codeine 100-10 MG/5ML syrup Take 10  mLs by mouth 3 (three) times daily as needed. 12/21/18   Chinita Pester, FNP  Naftifine HCl 2 % CREA Apply daily to affected area and half an inch margin surrounding area x2 weeks 06/21/18   Evon Slack, PA-C    Allergies Penicillins  No family history on file.  Social History Social History   Tobacco Use   Smoking status: Some Days    Packs/day: 1.00    Types: Cigarettes   Smokeless tobacco: Never  Vaping Use   Vaping Use: Never used  Substance Use Topics   Alcohol use: Yes   Drug use: No      Review of Systems Constitutional: No fever/chills Eyes: No visual changes. ENT: No sore throat. Cardiovascular: Denies chest pain. Respiratory: Denies shortness of breath. Gastrointestinal: No abdominal pain.  No nausea, no vomiting.  No diarrhea.  No constipation. Genitourinary: Negative for dysuria. Musculoskeletal: Right leg bite Skin: + rash Neurological: Negative for headaches, focal weakness or numbness. All other ROS negative ____________________________________________   PHYSICAL EXAM:  VITAL SIGNS: ED Triage Vitals  Enc Vitals Group     BP 07/22/21 1920 125/85     Pulse Rate 07/22/21 1920 71     Resp 07/22/21 1920 17     Temp 07/22/21 1920 98.3 F (36.8 C)  Temp Source 07/22/21 1920 Oral     SpO2 07/22/21 1920 98 %     Weight 07/22/21 1921 167 lb (75.8 kg)     Height 07/22/21 1921 5\' 7"  (1.702 m)     Head Circumference --      Peak Flow --      Pain Score 07/22/21 1924 9     Pain Loc --      Pain Edu? --      Excl. in GC? --     Constitutional: Alert and oriented. Well appearing and in no acute distress. Head: Atraumatic. Nose: No congestion/rhinnorhea. Mouth/Throat: Mucous membranes are moist.   Neck: No stridor. Trachea Midline. FROM Cardiovascular: Normal rate, good peripheral circulation Respiratory: Normal respiratory effort.  No retractions. Gastrointestinal: Soft and nontender. No distention. No abdominal bruits.  Musculoskeletal:  Patient has a irregular shaped redness little bit larger than a quarter noted to the inner aspect of his right leg.  There are patches of dry skin and  one small dot that is a slightly more red that could be a potential bug bite. Neurologic:  Normal speech and language. No gross focal neurologic deficits are appreciated.  Skin:  Skin is warm, dry and intact.  Rash as described on the leg. Psychiatric: Mood and affect are normal. Speech and behavior are normal. GU: Deferred   ____________________________________________     INITIAL IMPRESSION / ASSESSMENT AND PLAN / ED COURSE  07/24/21 was evaluated in Emergency Department on 07/22/2021 for the symptoms described in the history of present illness. He was evaluated in the context of the global COVID-19 pandemic, which necessitated consideration that the patient might be at risk for infection with the SARS-CoV-2 virus that causes COVID-19. Institutional protocols and algorithms that pertain to the evaluation of patients at risk for COVID-19 are in a state of rapid change based on information released by regulatory bodies including the CDC and federal and state organizations. These policies and algorithms were followed during the patient's care in the ED.    Patient comes in with potential bug bite noted to the right inner leg.  I discussed this and possibly know what bit him.  They deny it being near a snake.  It is not consistent with a tick bite.  There is no evidence of necrosis at this time.  Is a little bit red in nature but also, dry and flaky could have been from all of his itching.  I recommended some anti-itch medication we will give him some hydroxyzine.  Given the redness we will also start a course of Keflex just to make sure there is no overlying cellulitis.  Is a very small area right now and we discussed monitoring it and if he develops fevers, swelling in the leg, worsening spreading of the area to return to the ER.  He expressed  understanding felt comfortable with this plan.  Given the allergy to penicillin with no prior use of Keflex we will do a course of clindamycin. Will update tetanus.        ____________________________________________   FINAL CLINICAL IMPRESSION(S) / ED DIAGNOSES   Final diagnoses:  Insect bite, unspecified site, initial encounter      MEDICATIONS GIVEN DURING THIS VISIT:  Medications - No data to display   ED Discharge Orders          Ordered    clindamycin (CLEOCIN) 300 MG capsule  3 times daily        07/22/21 2025  hydrOXYzine (ATARAX/VISTARIL) 25 MG tablet  Every 8 hours PRN        07/22/21 2025             Note:  This document was prepared using Dragon voice recognition software and may include unintentional dictation errors.    Concha Se, MD 07/22/21 2026

## 2021-07-22 NOTE — ED Triage Notes (Signed)
Pt has possible insect bite to right lower leg for 1 week.  Pt has itching and redness no drainage.  Pt alert.

## 2021-07-22 NOTE — Discharge Instructions (Addendum)
To the antibiotics to help prevent infection.  Take the hydroxyzine to help with itching.  Can also get some over-the-counter calamine lotion or any other anti-itch creams to put on it.  To the ER if you develop fevers, worsening spreading or any other concerns

## 2021-11-16 ENCOUNTER — Other Ambulatory Visit: Payer: Self-pay

## 2021-11-16 ENCOUNTER — Encounter: Payer: Self-pay | Admitting: Intensive Care

## 2021-11-16 ENCOUNTER — Emergency Department
Admission: EM | Admit: 2021-11-16 | Discharge: 2021-11-16 | Disposition: A | Discharge status: Home / Self Care | Payer: Self-pay | Attending: Emergency Medicine | Admitting: Emergency Medicine

## 2021-11-16 ENCOUNTER — Emergency Department: Payer: Self-pay

## 2021-11-16 DIAGNOSIS — F17210 Nicotine dependence, cigarettes, uncomplicated: Secondary | ICD-10-CM | POA: Insufficient documentation

## 2021-11-16 DIAGNOSIS — Y92009 Unspecified place in unspecified non-institutional (private) residence as the place of occurrence of the external cause: Secondary | ICD-10-CM | POA: Insufficient documentation

## 2021-11-16 DIAGNOSIS — S9032XA Contusion of left foot, initial encounter: Secondary | ICD-10-CM | POA: Insufficient documentation

## 2021-11-16 DIAGNOSIS — J45909 Unspecified asthma, uncomplicated: Secondary | ICD-10-CM | POA: Insufficient documentation

## 2021-11-16 DIAGNOSIS — W1840XA Slipping, tripping and stumbling without falling, unspecified, initial encounter: Secondary | ICD-10-CM | POA: Insufficient documentation

## 2021-11-16 MED ORDER — IBUPROFEN 600 MG PO TABS: 600.0000 mg | ORAL_TABLET | Freq: Four times a day (QID) | ORAL | 0 refills | Status: AC | PRN

## 2021-11-16 NOTE — ED Provider Notes (Addendum)
[facility] Emergency Department Provider Note  ____________________________________________   Event Date/Time   First MD Initiated Contact with Patient 11/16/21 1059     (approximate)  I have reviewed the triage vital signs and the nursing notes.   HISTORY  Chief Complaint Foot Injury    HPI Jacob Duffy is a 45 y.o. male patient reports hitting his foot into a wall at home a few days ago.  States that he is having hard to bear weight which is why he came into the ER today pain is constant, nothing makes it better.  Denies falling and hitting his head or any other concerns.          Past Medical History:  Diagnosis Date   Asthma    History of seizures as a child     There are no problems to display for this patient.   History reviewed. No pertinent surgical history.  Prior to Admission medications   Medication Sig Start Date End Date Taking? Authorizing Provider  albuterol (PROVENTIL HFA;VENTOLIN HFA) 108 (90 Base) MCG/ACT inhaler Inhale 2 puffs into the lungs every 4 (four) hours as needed for wheezing or shortness of breath. 12/21/18   Triplett, Rulon Eisenmenger B, FNP  azithromycin (ZITHROMAX Z-PAK) 250 MG tablet Take 2 tablets (500 mg) on  Day 1,  followed by 1 tablet (250 mg) once daily on Days 2 through 5. 11/23/16   Beers, Charmayne Sheer, PA-C  chlorpheniramine (CHLOR-TRIMETON) 4 MG tablet Take 1 tablet (4 mg total) by mouth 2 (two) times daily as needed for allergies or rhinitis. 11/23/16   Beers, Charmayne Sheer, PA-C  cyclobenzaprine (FLEXERIL) 5 MG tablet Take 1 tablet (5 mg total) by mouth 3 (three) times daily as needed for muscle spasms. 12/12/17   Loleta Rose, MD  guaiFENesin-codeine 100-10 MG/5ML syrup Take 10 mLs by mouth 3 (three) times daily as needed. 12/21/18   Chinita Pester, FNP  Naftifine HCl 2 % CREA Apply daily to affected area and half an inch margin surrounding area x2 weeks 06/21/18   Evon Slack, PA-C     Allergies Penicillins  History reviewed. No pertinent family history.  Social History Social History   Tobacco Use   Smoking status: Every Day    Packs/day: 1.00    Types: Cigarettes   Smokeless tobacco: Never  Vaping Use   Vaping Use: Never used  Substance Use Topics   Alcohol use: Yes   Drug use: No      Review of Systems Constitutional: No fever/chills Eyes: No visual changes. ENT: No sore throat. Cardiovascular: Denies chest pain. Respiratory: Denies shortness of breath. Gastrointestinal: No abdominal pain.  No nausea, no vomiting.  No diarrhea.  No constipation. Genitourinary: Negative for dysuria. Musculoskeletal: Negative for back pain.  Positive for pain in the foot Skin: Negative for rash. Neurological: Negative for headaches, focal weakness or numbness. All other ROS negative ____________________________________________   PHYSICAL EXAM:  VITAL SIGNS: ED Triage Vitals  Enc Vitals Group     BP 11/16/21 1030 115/61     Pulse Rate 11/16/21 1030 76     Resp 11/16/21 1030 16     Temp 11/16/21 1030 97.8 F (36.6 C)     Temp Source 11/16/21 1030 Oral     SpO2 11/16/21 1030 98 %     Weight 11/16/21 1013 165 lb (74.8 kg)     Height 11/16/21 1013 5\' 7"  (1.702 m)     Head Circumference --  Peak Flow --      Pain Score 11/16/21 1013 10     Pain Loc --      Pain Edu? --      Excl. in GC? --     Constitutional: Alert and oriented. Well appearing and in no acute distress. Eyes: Conjunctivae are normal. EOMI. Head: Atraumatic. Nose: No congestion/rhinnorhea. Mouth/Throat: Mucous membranes are moist.   Neck: No stridor. Trachea Midline. FROM Cardiovascular: Normal rate, regular rhythm. Grossly normal heart sounds.  Good peripheral circulation. Respiratory: Normal respiratory effort.  No retractions. Lungs CTAB. Gastrointestinal: Soft and nontender. No distention. No abdominal bruits.  Musculoskeletal: No significant swelling but some tenderness  underneath the first metatarsal midfoot.  2+ distal pulse.  No significant ankle tenderness.  Able to dorsiflex and plantarflex the ankle Neurologic:  Normal speech and language. No gross focal neurologic deficits are appreciated.  Skin:  Skin is warm, dry and intact. No rash noted. Psychiatric: Mood and affect are normal. Speech and behavior are normal. GU: Deferred   _____ RADIOLOGY Vela Prose, personally viewed and evaluated these images (plain radiographs) as part of my medical decision making, as well as reviewing the written report by the radiologist.  ED MD interpretation:  no fractures   Official radiology report(s): DG Ankle Complete Left  Result Date: 11/16/2021 CLINICAL DATA:  Fall, hit foot on wall. Pain medial aspect of foot and ankle. EXAM: LEFT ANKLE COMPLETE - 3+ VIEW COMPARISON:  Foot series today FINDINGS: There is no evidence of fracture, dislocation, or joint effusion. There is no evidence of arthropathy or other focal bone abnormality. Soft tissues are unremarkable. IMPRESSION: Negative. Electronically Signed   By: Charlett Nose M.D.   On: 11/16/2021 11:24   DG Foot Complete Left  Result Date: 11/16/2021 CLINICAL DATA:  Fall, hit foot on wall EXAM: LEFT FOOT - COMPLETE 3+ VIEW COMPARISON:  None. FINDINGS: There is no evidence of fracture or dislocation. There is no evidence of arthropathy or other focal bone abnormality. Soft tissues are unremarkable. IMPRESSION: Negative. Electronically Signed   By: Charlett Nose M.D.   On: 11/16/2021 11:24    ____________________________________________   PROCEDURES  Procedure(s) performed (including Critical Care):  Procedures   ____________________________________________   INITIAL IMPRESSION / ASSESSMENT AND PLAN / ED COURSE  Jacob Duffy was evaluated in Emergency Department on 11/16/2021 for the symptoms described in the history of present illness. He was evaluated in the context of the global COVID-19 pandemic,  which necessitated consideration that the patient might be at risk for infection with the SARS-CoV-2 virus that causes COVID-19. Institutional protocols and algorithms that pertain to the evaluation of patients at risk for COVID-19 are in a state of rapid change based on information released by regulatory bodies including the CDC and federal and state organizations. These policies and algorithms were followed during the patient's care in the ED.     Ankle Most Likely DDx: Foot contusion  DDx that was also considered d/t potential to cause harm, but was found less likely based on history and physical (as detailed above): -Ankle fracture- will use the [place] ankle rules to determine if xray is warranted -Unlikely Maisonneuve fracture given no tenderness at the proximal fibula -Sandrea Hughs Fracture-no dorsal midfoot pain -Jones fracture-no 5th metatarsal tenderness -Achilles tendon rupture, able to point toes down on their own  Xray negative   Went to go see if patient needed crutches and patient was ambulatory out of the room  ____________________________________________   FINAL CLINICAL IMPRESSION(S) / ED DIAGNOSES   Final diagnoses:  Contusion of left foot, initial encounter      MEDICATIONS GIVEN DURING THIS VISIT:  Medications - No data to display   ED Discharge Orders          Ordered    ibuprofen (ADVIL) 600 MG tablet  Every 6 hours PRN        11/16/21 1141             Note:  This document was prepared using Dragon voice recognition software and may include unintentional dictation errors.    Concha Se, MD 11/16/21 1141    Concha Se, MD 11/16/21 1149

## 2021-11-16 NOTE — ED Triage Notes (Signed)
Patient reports slipping in hallway at home and hitting his foot into the wall. Patient limping when ambulating in ER. Incident happened a few days ago and is not getting any better

## 2021-11-16 NOTE — Discharge Instructions (Addendum)
He can take the ibuprofen with Tylenol 1 g every 8 hours to help with pain.  Return to ER if develop worsening pain or any other concerns
